# Patient Record
Sex: Female | Born: 1970 | Race: Black or African American | Hispanic: No | Marital: Single | Smoking: Never smoker
Health system: Southern US, Community
[De-identification: ages and names within clinical notes are randomized; demographics above are authoritative.]

## PROBLEM LIST (undated history)

## (undated) DIAGNOSIS — Z8742 Personal history of other diseases of the female genital tract: Secondary | ICD-10-CM

## (undated) DIAGNOSIS — Z8719 Personal history of other diseases of the digestive system: Secondary | ICD-10-CM

## (undated) DIAGNOSIS — R1032 Left lower quadrant pain: Secondary | ICD-10-CM

## (undated) DIAGNOSIS — D219 Benign neoplasm of connective and other soft tissue, unspecified: Secondary | ICD-10-CM

## (undated) DIAGNOSIS — I10 Essential (primary) hypertension: Secondary | ICD-10-CM

## (undated) DIAGNOSIS — Z87898 Personal history of other specified conditions: Secondary | ICD-10-CM

## (undated) DIAGNOSIS — F329 Major depressive disorder, single episode, unspecified: Secondary | ICD-10-CM

## (undated) DIAGNOSIS — Z86010 Personal history of colonic polyps: Secondary | ICD-10-CM

## (undated) DIAGNOSIS — Z8619 Personal history of other infectious and parasitic diseases: Secondary | ICD-10-CM

## (undated) DIAGNOSIS — D649 Anemia, unspecified: Secondary | ICD-10-CM

## (undated) DIAGNOSIS — M549 Dorsalgia, unspecified: Secondary | ICD-10-CM

## (undated) DIAGNOSIS — IMO0002 Reserved for concepts with insufficient information to code with codable children: Secondary | ICD-10-CM

## (undated) HISTORY — DX: Anemia, unspecified: D64.9

## (undated) HISTORY — PX: COLONOSCOPY: SHX174

## (undated) HISTORY — PX: OTHER SURGICAL HISTORY: SHX169

## (undated) HISTORY — DX: Personal history of other infectious and parasitic diseases: Z86.19

## (undated) HISTORY — DX: Personal history of other diseases of the female genital tract: Z87.42

## (undated) HISTORY — DX: Major depressive disorder, single episode, unspecified: F32.9

## (undated) HISTORY — PX: NO PAST SURGERIES: SHX2092

## (undated) HISTORY — DX: Dorsalgia, unspecified: M54.9

## (undated) HISTORY — DX: Personal history of other diseases of the digestive system: Z87.19

## (undated) HISTORY — DX: Left lower quadrant pain: R10.32

## (undated) HISTORY — DX: Essential (primary) hypertension: I10

## (undated) HISTORY — DX: Personal history of other specified conditions: Z87.898

## (undated) HISTORY — DX: Personal history of colonic polyps: Z86.010

## (undated) HISTORY — DX: Benign neoplasm of connective and other soft tissue, unspecified: D21.9

## (undated) HISTORY — DX: Reserved for concepts with insufficient information to code with codable children: IMO0002

---

## 1996-01-26 DIAGNOSIS — Z8719 Personal history of other diseases of the digestive system: Secondary | ICD-10-CM

## 1996-01-26 HISTORY — DX: Personal history of other diseases of the digestive system: Z87.19

## 1997-01-17 DIAGNOSIS — M549 Dorsalgia, unspecified: Secondary | ICD-10-CM

## 1997-01-17 DIAGNOSIS — Z87898 Personal history of other specified conditions: Secondary | ICD-10-CM

## 1997-01-17 HISTORY — DX: Dorsalgia, unspecified: M54.9

## 1997-01-17 HISTORY — DX: Personal history of other specified conditions: Z87.898

## 1997-05-01 DIAGNOSIS — Z8719 Personal history of other diseases of the digestive system: Secondary | ICD-10-CM

## 1997-05-01 HISTORY — DX: Personal history of other diseases of the digestive system: Z87.19

## 1997-07-11 DIAGNOSIS — D219 Benign neoplasm of connective and other soft tissue, unspecified: Secondary | ICD-10-CM

## 1997-07-11 HISTORY — DX: Benign neoplasm of connective and other soft tissue, unspecified: D21.9

## 1998-01-18 ENCOUNTER — Other Ambulatory Visit: Admission: RE | Admit: 1998-01-18 | Discharge: 1998-01-18 | Payer: Self-pay | Admitting: Obstetrics and Gynecology

## 1999-01-16 ENCOUNTER — Other Ambulatory Visit: Admission: RE | Admit: 1999-01-16 | Discharge: 1999-01-16 | Payer: Self-pay | Admitting: Obstetrics and Gynecology

## 1999-01-16 DIAGNOSIS — Z8719 Personal history of other diseases of the digestive system: Secondary | ICD-10-CM

## 1999-01-16 HISTORY — DX: Personal history of other diseases of the digestive system: Z87.19

## 2000-03-17 ENCOUNTER — Other Ambulatory Visit: Admission: RE | Admit: 2000-03-17 | Discharge: 2000-03-17 | Payer: Self-pay | Admitting: Obstetrics and Gynecology

## 2001-02-03 ENCOUNTER — Other Ambulatory Visit: Admission: RE | Admit: 2001-02-03 | Discharge: 2001-02-03 | Payer: Self-pay | Admitting: Obstetrics and Gynecology

## 2001-02-03 DIAGNOSIS — R1032 Left lower quadrant pain: Secondary | ICD-10-CM

## 2001-02-03 HISTORY — DX: Left lower quadrant pain: R10.32

## 2001-02-05 ENCOUNTER — Ambulatory Visit (HOSPITAL_COMMUNITY): Admission: RE | Admit: 2001-02-05 | Discharge: 2001-02-05 | Payer: Self-pay | Admitting: Obstetrics and Gynecology

## 2001-02-05 ENCOUNTER — Encounter: Payer: Self-pay | Admitting: Obstetrics and Gynecology

## 2001-12-15 ENCOUNTER — Inpatient Hospital Stay (HOSPITAL_COMMUNITY): Admission: AD | Admit: 2001-12-15 | Discharge: 2001-12-17 | Payer: Self-pay | Admitting: Obstetrics and Gynecology

## 2002-03-02 ENCOUNTER — Other Ambulatory Visit: Admission: RE | Admit: 2002-03-02 | Discharge: 2002-03-02 | Payer: Self-pay | Admitting: Obstetrics and Gynecology

## 2003-04-17 ENCOUNTER — Other Ambulatory Visit: Admission: RE | Admit: 2003-04-17 | Discharge: 2003-04-17 | Payer: Self-pay | Admitting: Obstetrics and Gynecology

## 2004-05-20 ENCOUNTER — Other Ambulatory Visit: Admission: RE | Admit: 2004-05-20 | Discharge: 2004-05-20 | Payer: Self-pay | Admitting: Obstetrics and Gynecology

## 2004-07-31 DIAGNOSIS — IMO0002 Reserved for concepts with insufficient information to code with codable children: Secondary | ICD-10-CM

## 2004-07-31 DIAGNOSIS — R87619 Unspecified abnormal cytological findings in specimens from cervix uteri: Secondary | ICD-10-CM

## 2004-07-31 HISTORY — DX: Reserved for concepts with insufficient information to code with codable children: IMO0002

## 2004-07-31 HISTORY — DX: Unspecified abnormal cytological findings in specimens from cervix uteri: R87.619

## 2004-12-03 ENCOUNTER — Other Ambulatory Visit: Admission: RE | Admit: 2004-12-03 | Discharge: 2004-12-03 | Payer: Self-pay | Admitting: Obstetrics and Gynecology

## 2005-05-21 ENCOUNTER — Other Ambulatory Visit: Admission: RE | Admit: 2005-05-21 | Discharge: 2005-05-21 | Payer: Self-pay | Admitting: Obstetrics and Gynecology

## 2005-11-24 ENCOUNTER — Other Ambulatory Visit: Admission: RE | Admit: 2005-11-24 | Discharge: 2005-11-24 | Payer: Self-pay | Admitting: Obstetrics and Gynecology

## 2010-05-20 ENCOUNTER — Encounter: Admission: RE | Admit: 2010-05-20 | Discharge: 2010-05-20 | Payer: Self-pay | Admitting: Family Medicine

## 2010-07-08 ENCOUNTER — Encounter: Payer: Self-pay | Admitting: Family Medicine

## 2010-11-01 NOTE — H&P (Signed)
[facility]  Patient:    Mary Preston, Mary Preston Visit Number: [id number] MRN: [id number]          Service Type: OBS Location: 910B [id number] [id number] Attending Physician:  Shaune Spittle Dictated by:   Wynelle Bourgeois, CNM Admit Date:  12/15/2001                           History and Physical  CHIEF COMPLAINT:              This is a 40 year old gravida 2 para 1, 0-0-1, at 40-6/7th weeks, who presents with complaint of leaking clear fluid since 1 a.m.  HISTORY OF PRESENT ILLNESS:   She denies feeling contractions.  Her pregnancy has been followed by the M.D. service and remarkable for:                               1. Late care.                               2. Desires tubal ligation, which she currently                                  now declines.  PAST OBSTETRICAL HISTORY:     Remarkable for vaginal delivery in 1993 of a female infant at [redacted] weeks gestation weighing 7 pounds 9 ounces.  PAST MEDICAL HISTORY:         1. Childhood varicella.                               2. Irritable bowel syndrome.  PAST SURGICAL HISTORY:        Laparoscopy in 1999.  FAMILY HISTORY:               Remarkable for myocardial infarction and hypertension in her paternal aunts and uncles.  Maternal grandmother with an aneurysm.  Paternal aunts with diabetes and thyroid problems.  Substance abuse in several family members.  GENETIC HISTORY:              Remarkable for father of the baby being 6 years old and the patients son who has sickle cell trait.  SOCIAL HISTORY:               The patient is single but involved with [employer].  She is of nondenominational Christian faith and denies any alcohol, tobacco, or drug use.  PRENATAL LABORATORY DATA:     Hemoglobin 12.3, platelets 264,000.  Blood type B-positive.  Antibody screen negative.  RPR nonreactive.  Rubella immune. HBsAg negative.  HIV nonreactive.  Pap smear normal.  Gonorrhea negative. Chlamydia negative.   Glucose challenge negative.  Group B strep negative.  PHYSICAL EXAMINATION:  VITAL SIGNS:                  Stable.  Afebrile.  HEENT:                        Within normal limits.  NECK:                         Thyroid normal, not enlarged.  CHEST:  Clear to auscultation.  HEART:                        Regular rate and rhythm.  ABDOMEN:                      Gravid. Vertex to [place].  EFW approximately approximately 7-1/2 pounds.  PELVIC:                       Sterile speculum examination - positive pooling, positive nitrazine, positive ferning.  Clear fluid leaking from vagina. Cervical examination - 1 cm, 50% effaced, -2 station with vertex well applied.  EXTREMITIES:                  Within normal limits.  ASSESSMENT:                   1. Intrauterine pregnancy at 40-6/7th weeks.                               2. Spontaneous rupture of membranes.                               3. Early labor (not in active labor yet).  PLAN:                         1. Admit to birthing suite per Dr. Pennie Rushing.                               2. Routine M.D. orders.                               3. Start Pitocin at 7 a.m. if not in labor. Dictated by:   Wynelle Bourgeois, CNM Attending Physician:  Shaune Spittle DD:  12/15/01 TD:  12/15/01 Job: [id number]

## 2011-05-01 ENCOUNTER — Other Ambulatory Visit: Payer: Self-pay | Admitting: Family Medicine

## 2011-05-01 DIAGNOSIS — Z1231 Encounter for screening mammogram for malignant neoplasm of breast: Secondary | ICD-10-CM

## 2011-05-26 ENCOUNTER — Ambulatory Visit
Admission: RE | Admit: 2011-05-26 | Discharge: 2011-05-26 | Disposition: A | Discharge status: Home / Self Care | Payer: BC Managed Care – PPO | Source: Ambulatory Visit | Attending: Family Medicine | Admitting: Family Medicine

## 2011-05-26 DIAGNOSIS — Z1231 Encounter for screening mammogram for malignant neoplasm of breast: Secondary | ICD-10-CM

## 2011-08-13 ENCOUNTER — Ambulatory Visit: Payer: Self-pay | Admitting: Obstetrics and Gynecology

## 2011-09-16 ENCOUNTER — Ambulatory Visit (INDEPENDENT_AMBULATORY_CARE_PROVIDER_SITE_OTHER): Payer: BC Managed Care – PPO | Admitting: Obstetrics and Gynecology

## 2011-09-16 DIAGNOSIS — F32A Depression, unspecified: Secondary | ICD-10-CM

## 2011-09-16 DIAGNOSIS — Z01419 Encounter for gynecological examination (general) (routine) without abnormal findings: Secondary | ICD-10-CM

## 2011-09-16 HISTORY — DX: Depression, unspecified: F32.A

## 2011-10-09 ENCOUNTER — Ambulatory Visit (INDEPENDENT_AMBULATORY_CARE_PROVIDER_SITE_OTHER): Payer: BC Managed Care – PPO | Admitting: Obstetrics and Gynecology

## 2011-10-09 ENCOUNTER — Encounter: Payer: Self-pay | Admitting: Obstetrics and Gynecology

## 2011-10-09 VITALS — BP 110/70 | Ht 64.0 in | Wt 147.0 lb

## 2011-10-09 DIAGNOSIS — E663 Overweight: Secondary | ICD-10-CM

## 2011-10-09 DIAGNOSIS — F329 Major depressive disorder, single episode, unspecified: Secondary | ICD-10-CM

## 2011-10-09 DIAGNOSIS — R21 Rash and other nonspecific skin eruption: Secondary | ICD-10-CM

## 2011-10-09 DIAGNOSIS — F4329 Adjustment disorder with other symptoms: Secondary | ICD-10-CM

## 2011-10-09 NOTE — Progress Notes (Signed)
[name] is a 41 y.o. year old female,No obstetric history on file., who presents for a problem visit.  Subjective:  The patient's mother died last year and she continues to grieve.  She was started on Celexa 20 mg each day.  She reports that she did feel better, but developed a rash on her lips that was very concerning.  She discontinued her medication.  Objective:  BP 110/70  Ht 5\' 4"  (1.626 m)  Wt 147 lb (66.679 kg)  BMI 25.23 kg/m2  LMP 09/13/2011   General: mild distress The patient has a dry and blistering rash on her lips.  Exam deferred.  Assessment:  Grief reaction  Rash on her lips  Plan:  HSV 1  A 20 min. Visit (greater than 50% face-to-face) was conducted as we talk about the above diagnosis.  The patient is very stressed about the rash on her lips.  Return to office in 4 week(s).   Leonard Schwartz M.D.  10/09/2011 3:56 PM

## 2011-10-09 NOTE — Patient Instructions (Signed)
Fever Blisters, Herpes Simplex Herpes simplex is a virus. This virus causes fever blisters or cold sores. Fever blisters are small sores on the lips, gums, or roof of the mouth. People often get infected with this herpes virus but do not have any symptoms. The blisters may break out when a person is:  Tired.   Under stress.   Suffering from another infection (such as a cold).   Exposed to sunlight.  The blisters usually heal within 1 week. The virus can be easily passed to other people and to other parts of the body, such as the eyes and sex organs. CAUSES  A virus, herpes simplex, is the cause of fever blisters. This virus can be passed (transmitted) from person to person and is therefore contagious. There are 2 types of herpes simplex virus. Type 1 usually causes oral herpes or fever blisters. Type 2 usually causes genital herpes. Both viruses do have the potential to cause oral and genital infections. However, the type 1 virus causes more than 90% of recurrent fever blister outbreaks.  Herpes simplex virus is highly contagious when fever blisters are present. Close contact, including kissing, can spread the virus. Children often become infected by contact with others who have fever blisters. A child can spread the virus by rubbing the cold sore and touching other children or when other children touch clothing, wipes, or toys contaminated by an infected child with the virus. In adults, about 10% of oral herpes infections are from oral-genital sex with a person who has active genital herpes (type 2).  Type 1 herpes infection is very common, eventually occurring in up to 8 out of 10 otherwise healthy people. Most people become infected before they are 42 years old. The virus usually infects the lips, throat, or mouth. Initial infection in children can be extensive with many lesions throughout the mouth. In adults, the first infection may cause no symptoms. Some adults may develop many fluid-filled  blisters inside and outside the mouth 3 to 5 days after they are initially infected but severe infection is uncommon. Fever, swollen neck glands, and general aches may occur but this is also uncommon. The blisters tend to come together and then collapse. When on the lip, a yellowish crust forms over the sores. Healing of the area without scarring typically occurs within 2 weeks. Once a person is infected, the herpes virus permanently remains alive in the body within a nerve near the cheekbone. It then stays inactive at this site, only to sometimes travel down the nerve to the skin. This causes a recurrence of fever blisters. Recurrent blisters usually break out at the outside edge of the lip or edge of the nostril. Recurrent fever blisters may occasionally occur on the chin, cheeks, or inside the mouth. Recurrent fever blister attacks are usually not as painful and not as numerous as the first infection. Recurrences are less frequent after age 35. Many people who have recurring fever blisters feel itching, tingling, or burning at the lip border. This can occur hours or a couple days before the blister appears.  Factors which weaken the body's immune system may trigger an outbreak or recurrence of herpes. These include some drugs (such as steroids), emotional stress, fever, illness, sleep deprivation, and other injuries. Sunlight may also trigger an outbreak. Many women have recurrences only during their menstrual period.  TREATMENT There is no cure for fever blisters. There is no vaccine for herpes simplex virus.  Certain medicines can relieve some of the pain   and discomfort of the sores or promote more rapid healing. These include ointments that numb the blisters and medicines that control bacterial infections (antibiotics). A number of drugs active against herpes viruses (antivirals), either applied locally as a gel or cream, or taken in pill form, may promote healing by keeping the virus from multiplying  and infecting more local tissue.   Keep fever blisters clean and dry. This helps to prevent bacterial invasion of the virally infected tissues.   Eat a soft, bland diet to avoid irritating the sores.   Be careful not to touch the sores and spread the virus to new sites, such as:   Other areas of the face.   Eyes.   Genitals.   Make sure you do not infect others. Avoid kissing people when a fever blister is present. Avoid touching the sores and then touching others.   Sunscreen on the lips can prevent recurrences if outbreaks are triggered by sunlight. The sunscreen should be put on before going outside and reapplied often while in the sun.   Avoid stress if this seems to cause outbreaks.  HOME CARE INSTRUCTIONS   Only take over-the-counter or prescription medicines for pain, discomfort, or fever as directed by your caregiver. Do not use aspirin.   Do not touch the blisters or pick the scabs. Wash your hands often. Do not touch your eyes without washing your hands first.   Avoid close contact with other people, especially kissing, until blisters heal.   Hot, cold, or salty foods may hurt your mouth. Use a straw to drink. Eating a well-balanced diet will help healing.  SEEK MEDICAL CARE IF:   Your eye feels irritated, painful, or you feel like you have something in your eye.   You develop a fever, feel achy, or see pus instead of clear fluid in the sores. These are signs of a bacterial infection.   You get blisters on your genitals.   You develop new, unexplained symptoms.  MAKE SURE YOU:   Understand these instructions.   Will watch your condition.   Will get help right away if you are not doing well or get worse.  Document Released: 06/02/2005 Document Revised: 05/22/2011 Document Reviewed: 10/07/2007 Exit[employer] Patient Information 2012 [employer]. 

## 2011-10-10 LAB — HSV 1 ANTIBODY, IGG: HSV 1 Glycoprotein G Ab, IgG: <0.1 IV

## 2011-11-12 ENCOUNTER — Ambulatory Visit (INDEPENDENT_AMBULATORY_CARE_PROVIDER_SITE_OTHER): Payer: BC Managed Care – PPO | Admitting: Obstetrics and Gynecology

## 2011-11-12 ENCOUNTER — Encounter: Payer: Self-pay | Admitting: Obstetrics and Gynecology

## 2011-11-12 VITALS — BP 120/88 | Resp 16 | Ht 64.0 in | Wt 150.0 lb

## 2011-11-12 DIAGNOSIS — F4329 Adjustment disorder with other symptoms: Secondary | ICD-10-CM

## 2011-11-12 DIAGNOSIS — F329 Major depressive disorder, single episode, unspecified: Secondary | ICD-10-CM

## 2011-11-12 DIAGNOSIS — R21 Rash and other nonspecific skin eruption: Secondary | ICD-10-CM

## 2011-11-12 MED ORDER — FLUOXETINE HCL 20 MG PO CAPS: 20.0000 mg | ORAL_CAPSULE | Freq: Every day | ORAL | Status: DC

## 2011-11-12 NOTE — Progress Notes (Signed)
[name] is a 41 y.o. year old female,G2P2002, who presents for a problem visit. The patient's mother died 1 year ago and she continues to grieve.  She was started on Celexa and reports that she did feel better.  She developed a blistering rash on her lips.  She was treated with prednisone and Valtrex by her primary physician.  Her herpes test returned negative.  The patient discontinued her Celexa.  The rash resolved.  Subjective:  The patient reports that she again is very sad (worse since she stopped her medication).  Objective:  BP 120/88  Resp 16  Ht 5\' 4"  (1.626 m)  Wt 150 lb (68.04 kg)  BMI 25.75 kg/m2  LMP 11/01/2011   General: alert, cooperative and she cries as we discussed the death of her mother.  The rash on her lips has completely resolved.  Exam deferred.  Assessment:  Grief reaction Rash on her lips has resolved  Plan:  Grief discussed.  We will begin Prozac.  The patient will call if her rash returns.  She declined to start Celexa again.  Return to office in 6 week(s).   Leonard Schwartz M.D.  11/12/2011 2:21 PM

## 2011-12-24 ENCOUNTER — Encounter: Payer: BC Managed Care – PPO | Admitting: Obstetrics and Gynecology

## 2012-04-26 ENCOUNTER — Other Ambulatory Visit: Payer: Self-pay | Admitting: Family Medicine

## 2012-04-26 DIAGNOSIS — Z1231 Encounter for screening mammogram for malignant neoplasm of breast: Secondary | ICD-10-CM

## 2012-06-03 ENCOUNTER — Ambulatory Visit
Admission: RE | Admit: 2012-06-03 | Discharge: 2012-06-03 | Disposition: A | Discharge status: Home / Self Care | Payer: BC Managed Care – PPO | Source: Ambulatory Visit | Attending: Family Medicine | Admitting: Family Medicine

## 2012-06-03 DIAGNOSIS — Z1231 Encounter for screening mammogram for malignant neoplasm of breast: Secondary | ICD-10-CM

## 2013-05-03 ENCOUNTER — Other Ambulatory Visit: Payer: Self-pay

## 2013-05-03 DIAGNOSIS — Z1231 Encounter for screening mammogram for malignant neoplasm of breast: Secondary | ICD-10-CM

## 2013-06-06 ENCOUNTER — Ambulatory Visit
Admission: RE | Admit: 2013-06-06 | Discharge: 2013-06-06 | Disposition: A | Discharge status: Home / Self Care | Payer: BC Managed Care – PPO | Source: Ambulatory Visit

## 2013-06-06 DIAGNOSIS — Z1231 Encounter for screening mammogram for malignant neoplasm of breast: Secondary | ICD-10-CM

## 2014-04-17 ENCOUNTER — Encounter: Payer: Self-pay | Admitting: Obstetrics and Gynecology

## 2014-05-18 ENCOUNTER — Other Ambulatory Visit: Payer: Self-pay

## 2014-05-18 DIAGNOSIS — Z1231 Encounter for screening mammogram for malignant neoplasm of breast: Secondary | ICD-10-CM

## 2014-06-07 ENCOUNTER — Ambulatory Visit
Admission: RE | Admit: 2014-06-07 | Discharge: 2014-06-07 | Disposition: A | Discharge status: Home / Self Care | Payer: PRIVATE HEALTH INSURANCE | Source: Ambulatory Visit

## 2014-06-07 ENCOUNTER — Other Ambulatory Visit: Payer: Self-pay

## 2014-06-07 DIAGNOSIS — Z1231 Encounter for screening mammogram for malignant neoplasm of breast: Secondary | ICD-10-CM

## 2015-06-26 DIAGNOSIS — L658 Other specified nonscarring hair loss: Secondary | ICD-10-CM | POA: Diagnosis not present

## 2015-06-26 DIAGNOSIS — L908 Other atrophic disorders of skin: Secondary | ICD-10-CM | POA: Diagnosis not present

## 2015-06-26 DIAGNOSIS — L309 Dermatitis, unspecified: Secondary | ICD-10-CM | POA: Diagnosis not present

## 2015-06-26 DIAGNOSIS — D239 Other benign neoplasm of skin, unspecified: Secondary | ICD-10-CM | POA: Diagnosis not present

## 2015-06-26 DIAGNOSIS — L84 Corns and callosities: Secondary | ICD-10-CM | POA: Diagnosis not present

## 2015-09-11 DIAGNOSIS — D2312 Other benign neoplasm of skin of left eyelid, including canthus: Secondary | ICD-10-CM | POA: Diagnosis not present

## 2015-09-11 DIAGNOSIS — D2311 Other benign neoplasm of skin of right eyelid, including canthus: Secondary | ICD-10-CM | POA: Diagnosis not present

## 2015-10-23 DIAGNOSIS — R102 Pelvic and perineal pain: Secondary | ICD-10-CM | POA: Diagnosis not present

## 2015-10-23 DIAGNOSIS — Z6827 Body mass index (BMI) 27.0-27.9, adult: Secondary | ICD-10-CM | POA: Diagnosis not present

## 2015-10-23 DIAGNOSIS — Z124 Encounter for screening for malignant neoplasm of cervix: Secondary | ICD-10-CM | POA: Diagnosis not present

## 2015-10-23 DIAGNOSIS — Z01419 Encounter for gynecological examination (general) (routine) without abnormal findings: Secondary | ICD-10-CM | POA: Diagnosis not present

## 2015-10-29 ENCOUNTER — Other Ambulatory Visit: Payer: Self-pay | Admitting: Obstetrics and Gynecology

## 2015-10-29 DIAGNOSIS — R109 Unspecified abdominal pain: Secondary | ICD-10-CM

## 2015-11-06 ENCOUNTER — Ambulatory Visit (HOSPITAL_COMMUNITY): Payer: PRIVATE HEALTH INSURANCE

## 2015-11-09 ENCOUNTER — Other Ambulatory Visit: Payer: PRIVATE HEALTH INSURANCE

## 2015-11-09 ENCOUNTER — Encounter (HOSPITAL_COMMUNITY): Payer: Self-pay

## 2015-11-09 ENCOUNTER — Ambulatory Visit (HOSPITAL_COMMUNITY)
Admission: RE | Admit: 2015-11-09 | Discharge: 2015-11-09 | Disposition: A | Discharge status: Home / Self Care | Payer: 59 | Source: Ambulatory Visit | Attending: Obstetrics and Gynecology | Admitting: Obstetrics and Gynecology

## 2015-11-09 DIAGNOSIS — R938 Abnormal findings on diagnostic imaging of other specified body structures: Secondary | ICD-10-CM | POA: Insufficient documentation

## 2015-11-09 DIAGNOSIS — D259 Leiomyoma of uterus, unspecified: Secondary | ICD-10-CM | POA: Diagnosis not present

## 2015-11-09 DIAGNOSIS — R109 Unspecified abdominal pain: Secondary | ICD-10-CM | POA: Insufficient documentation

## 2015-11-09 MED ORDER — IOPAMIDOL (ISOVUE-300) INJECTION 61%
INTRAVENOUS | Status: AC
  Filled 2015-11-09: qty 100

## 2015-11-13 MED ORDER — IOPAMIDOL (ISOVUE-300) INJECTION 61%
100.0000 mL | Freq: Once | INTRAVENOUS | Status: AC | PRN
  Administered 2015-11-09: 100 mL via INTRAVENOUS

## 2016-02-14 DIAGNOSIS — R109 Unspecified abdominal pain: Secondary | ICD-10-CM | POA: Diagnosis not present

## 2016-02-14 DIAGNOSIS — R591 Generalized enlarged lymph nodes: Secondary | ICD-10-CM | POA: Diagnosis not present

## 2016-02-14 DIAGNOSIS — D259 Leiomyoma of uterus, unspecified: Secondary | ICD-10-CM | POA: Diagnosis not present

## 2016-04-22 DIAGNOSIS — E663 Overweight: Secondary | ICD-10-CM | POA: Diagnosis not present

## 2016-04-22 DIAGNOSIS — Z114 Encounter for screening for human immunodeficiency virus [HIV]: Secondary | ICD-10-CM | POA: Diagnosis not present

## 2016-04-22 DIAGNOSIS — Z131 Encounter for screening for diabetes mellitus: Secondary | ICD-10-CM | POA: Diagnosis not present

## 2016-04-22 DIAGNOSIS — M7989 Other specified soft tissue disorders: Secondary | ICD-10-CM | POA: Diagnosis not present

## 2016-04-22 DIAGNOSIS — E782 Mixed hyperlipidemia: Secondary | ICD-10-CM | POA: Diagnosis not present

## 2016-06-12 DIAGNOSIS — Z1231 Encounter for screening mammogram for malignant neoplasm of breast: Secondary | ICD-10-CM | POA: Diagnosis not present

## 2016-06-13 DIAGNOSIS — M545 Low back pain: Secondary | ICD-10-CM | POA: Diagnosis not present

## 2016-06-13 DIAGNOSIS — R1032 Left lower quadrant pain: Secondary | ICD-10-CM | POA: Diagnosis not present

## 2016-06-13 MED FILL — NAPROXEN 500 MG TABLET: 500 | 15 days supply | Qty: 30 | Fill #0

## 2016-06-18 ENCOUNTER — Other Ambulatory Visit (HOSPITAL_COMMUNITY): Payer: Self-pay | Admitting: Family Medicine

## 2016-06-18 DIAGNOSIS — R1032 Left lower quadrant pain: Secondary | ICD-10-CM

## 2016-06-18 DIAGNOSIS — R935 Abnormal findings on diagnostic imaging of other abdominal regions, including retroperitoneum: Secondary | ICD-10-CM

## 2016-06-27 MED FILL — OSELTAMIVIR PHOS 75 MG CAP: 75 | 10 days supply | Qty: 10 | Fill #0

## 2016-07-08 ENCOUNTER — Ambulatory Visit (HOSPITAL_COMMUNITY): Payer: PRIVATE HEALTH INSURANCE

## 2016-07-08 ENCOUNTER — Ambulatory Visit (HOSPITAL_COMMUNITY)
Admission: RE | Admit: 2016-07-08 | Discharge: 2016-07-08 | Disposition: A | Discharge status: Home / Self Care | Payer: 59 | Source: Ambulatory Visit | Attending: Family Medicine | Admitting: Family Medicine

## 2016-07-08 ENCOUNTER — Encounter (HOSPITAL_COMMUNITY): Payer: Self-pay

## 2016-07-08 DIAGNOSIS — N852 Hypertrophy of uterus: Secondary | ICD-10-CM | POA: Diagnosis not present

## 2016-07-08 DIAGNOSIS — R1032 Left lower quadrant pain: Secondary | ICD-10-CM | POA: Insufficient documentation

## 2016-07-08 DIAGNOSIS — D259 Leiomyoma of uterus, unspecified: Secondary | ICD-10-CM | POA: Insufficient documentation

## 2016-07-08 DIAGNOSIS — R935 Abnormal findings on diagnostic imaging of other abdominal regions, including retroperitoneum: Secondary | ICD-10-CM | POA: Insufficient documentation

## 2016-07-08 MED ORDER — IOPAMIDOL (ISOVUE-300) INJECTION 61%
100.0000 mL | Freq: Once | INTRAVENOUS | Status: AC | PRN
  Administered 2016-07-08: 100 mL via INTRAVENOUS

## 2016-07-23 DIAGNOSIS — H52203 Unspecified astigmatism, bilateral: Secondary | ICD-10-CM | POA: Diagnosis not present

## 2016-07-23 DIAGNOSIS — H5213 Myopia, bilateral: Secondary | ICD-10-CM | POA: Diagnosis not present

## 2016-10-29 DIAGNOSIS — Z124 Encounter for screening for malignant neoplasm of cervix: Secondary | ICD-10-CM | POA: Diagnosis not present

## 2016-10-29 DIAGNOSIS — R635 Abnormal weight gain: Secondary | ICD-10-CM | POA: Diagnosis not present

## 2016-10-29 DIAGNOSIS — Z6829 Body mass index (BMI) 29.0-29.9, adult: Secondary | ICD-10-CM | POA: Diagnosis not present

## 2016-10-29 DIAGNOSIS — D259 Leiomyoma of uterus, unspecified: Secondary | ICD-10-CM | POA: Diagnosis not present

## 2016-10-29 DIAGNOSIS — Z01411 Encounter for gynecological examination (general) (routine) with abnormal findings: Secondary | ICD-10-CM | POA: Diagnosis not present

## 2016-11-06 MED FILL — PREVIDENT 5000 ENAMEL PROTE: 1.1-5 | 20 days supply | Qty: 100 | Fill #0

## 2016-11-21 DIAGNOSIS — G479 Sleep disorder, unspecified: Secondary | ICD-10-CM | POA: Diagnosis not present

## 2016-11-21 DIAGNOSIS — M7989 Other specified soft tissue disorders: Secondary | ICD-10-CM | POA: Diagnosis not present

## 2016-11-21 DIAGNOSIS — Z713 Dietary counseling and surveillance: Secondary | ICD-10-CM | POA: Diagnosis not present

## 2017-04-06 DIAGNOSIS — R7309 Other abnormal glucose: Secondary | ICD-10-CM | POA: Diagnosis not present

## 2017-04-06 DIAGNOSIS — R030 Elevated blood-pressure reading, without diagnosis of hypertension: Secondary | ICD-10-CM | POA: Diagnosis not present

## 2017-04-06 DIAGNOSIS — E7800 Pure hypercholesterolemia, unspecified: Secondary | ICD-10-CM | POA: Diagnosis not present

## 2017-04-06 MED FILL — PREVIDENT 5000 ENAMEL PROTE: 1.1-5 | 20 days supply | Qty: 100 | Fill #1

## 2017-04-21 ENCOUNTER — Encounter: Payer: Self-pay | Admitting: Nurse Practitioner

## 2017-05-01 ENCOUNTER — Encounter (INDEPENDENT_AMBULATORY_CARE_PROVIDER_SITE_OTHER): Payer: Self-pay

## 2017-05-01 ENCOUNTER — Encounter: Payer: Self-pay | Admitting: Nurse Practitioner

## 2017-05-01 ENCOUNTER — Ambulatory Visit: Payer: 59 | Admitting: Nurse Practitioner

## 2017-05-01 VITALS — BP 106/68 | HR 80 | Ht 64.0 in | Wt 156.6 lb

## 2017-05-01 DIAGNOSIS — K5900 Constipation, unspecified: Secondary | ICD-10-CM | POA: Diagnosis not present

## 2017-05-01 DIAGNOSIS — R1032 Left lower quadrant pain: Secondary | ICD-10-CM

## 2017-05-01 NOTE — Progress Notes (Addendum)
Chief Complaint:  Left lower quadrant pain  HPI:   Patient is a 46 year old female with a 20 year history of intermittent LLQ pain radiating through to left lower back.  She had an exploratory lap in 1999 when pain started but nothing found. Pain not related to eating, not relieved with BM and it isn't related to activity. Pain does not wake up up at night. Pain is random and for years occurred only a couple of times a year. Over the last month or two she has been having more frequent episodes of the pain. Sometimes the pain can last all day.Ibuprofen helps  Bowel habits are at baseline,  BM 2-3 a week.  Saw [facility] about 8 years ago, given Amitiza but having more frequent BMs didn't help the pain.  She was seen at [facility] 2-3 times, no endoscopic workup done. She did have a CT scan in 2017 and another in 2018 without acute findings. She did have some prominent retroperitoneal lymph nodes but they decreased in size between 2017 and 2018. She has known uterine fibroids. Up to date on GYN exams. She has no blood in her stool. Weight is stable. No urinary sx.    Past Medical History:  Diagnosis Date  . Abdominal pain 1998  . Abnormal Pap smear 07/31/04   CIN 1  . Back pain 01/17/97  . Depression 09/16/11  . Fibroid 07/11/97  . H/O constipation 01/26/1996   With rectal bleeding  . H/O dysmenorrhea   . H/O fatigue 01/17/97  . H/O varicella   . History of rectal bleeding 05/01/97  . Hx of irritable bowel syndrome 01/16/1999  . Hypertension    08/02/09  . Irregular periods/menstrual cycles 2006  . LGSIL (low grade squamous intraepithelial dysplasia)   . LLQ pain 02/03/2001     Past Surgical History:  Procedure Laterality Date  . laposcopy     Family History  Problem Relation Age of Onset  . Hypertension Paternal Aunt   . Diabetes Paternal Aunt   . Heart disease Paternal Aunt        MI  . Hypertension Paternal Uncle   . Heart disease Paternal Uncle   . Colon cancer Neg Hx   . Stomach  cancer Neg Hx    Social History   Tobacco Use  . Smoking status: Never Smoker  . Smokeless tobacco: Never Used  Substance Use Topics  . Alcohol use: No  . Drug use: No   Current Outpatient Medications  Medication Sig Dispense Refill  . Biotin 1000 MCG tablet Take 1,000 mcg 3 (three) times daily by mouth.    . Cyanocobalamin (VITAMIN B 12 PO) Take by mouth.    . vitamin C (ASCORBIC ACID) 500 MG tablet Take 500 mg daily by mouth.    Marland Kitchen VITAMIN D, ERGOCALCIFEROL, PO Take by mouth.    Marland Kitchen FLUoxetine (PROZAC) 20 MG capsule Take 1 capsule (20 mg total) by mouth daily. 30 capsule 11  . Linoleic Acid Conjugated (CLA PO) Take by mouth.     No current facility-administered medications for this visit.    No Known Allergies   Review of Systems: All systems reviewed and negative except where noted in HPI.    Physical Exam: BP 106/68   Pulse 80   Ht 5\' 4"  (1.626 m)   Wt 156 lb 9.6 oz (71 kg)   BMI 26.88 kg/m  Constitutional:  Well-developed, female in no acute distress. Psychiatric: Normal mood and affect. Behavior  is normal. EENT: Pupils normal.  Conjunctivae are normal. No scleral icterus. Neck supple.  Cardiovascular: Normal rate, regular rhythm. No edema Pulmonary/chest: Effort normal and breath sounds normal. No wheezing, rales or rhonchi. Abdominal: Soft, nondistended. Nontender. Bowel sounds active throughout. There are no masses palpable. No hepatomegaly. Lymphadenopathy: No cervical adenopathy noted. Neurological: Alert and oriented to person place and time. Skin: Skin is warm and dry. No rashes noted.   ASSESSMENT AND PLAN:   46 year old female with 20-year history of LLQ pain.  She has been evaluated by gynecology.  CT scan in 2017 in 2018 were unrevealing.  Pain not related to eating nor bowel movements.  It seems unlikely that her pain is GI in nature but patient would like to proceed with further GI workup anyway .  She is interested in a colonoscopy.  I explained that  colonoscopy done solely for evaluation of pain may not yield much information regarding cause of pain.  The risks and benefits of colonoscopy with possible biopsies and/or polypectomy were discussed and the patient agrees to proceed.     Tye Savoy, NP  05/01/2017, 9:57 AM   Agree with Ms. Doren Kaspar's assessment and plan.  Since the patient is > 45 and African American this can be a screening exam  I will have dx changed  Gatha Mayer, MD, [facility]

## 2017-05-01 NOTE — Patient Instructions (Signed)
If you are age 47 or older, your body mass index should be between 23-30. Your Body mass index is 26.88 kg/m. If this is out of the aforementioned range listed, please consider follow up with your Primary Care Provider.  If you are age 89 or younger, your body mass index should be between 19-25. Your Body mass index is 26.88 kg/m. If this is out of the aformentioned range listed, please consider follow up with your Primary Care Provider.   You have been scheduled for a colonoscopy. Please follow written instructions given to you at your visit today.  Please pick up your prep supplies at the pharmacy within the next 1-3 days. If you use inhalers (even only as needed), please bring them with you on the day of your procedure. Your physician has requested that you go to www.startemmi.com and enter the access code given to you at your visit today. This web site gives a general overview about your procedure. However, you should still follow specific instructions given to you by our office regarding your preparation for the procedure.  Thank you for choosing me and [facility].   Tye Savoy, NP

## 2017-05-05 ENCOUNTER — Encounter: Payer: Self-pay | Admitting: Nurse Practitioner

## 2017-05-14 ENCOUNTER — Encounter: Payer: Self-pay | Admitting: Internal Medicine

## 2017-05-19 ENCOUNTER — Ambulatory Visit (AMBULATORY_SURGERY_CENTER): Payer: 59 | Admitting: Internal Medicine

## 2017-05-19 ENCOUNTER — Encounter: Payer: Self-pay | Admitting: Internal Medicine

## 2017-05-19 ENCOUNTER — Other Ambulatory Visit: Payer: Self-pay

## 2017-05-19 VITALS — BP 125/65 | HR 64 | Temp 98.2°F | Resp 16 | Ht 64.0 in | Wt 156.0 lb

## 2017-05-19 DIAGNOSIS — D125 Benign neoplasm of sigmoid colon: Secondary | ICD-10-CM | POA: Diagnosis not present

## 2017-05-19 DIAGNOSIS — K635 Polyp of colon: Secondary | ICD-10-CM

## 2017-05-19 DIAGNOSIS — D120 Benign neoplasm of cecum: Secondary | ICD-10-CM | POA: Diagnosis not present

## 2017-05-19 DIAGNOSIS — R1032 Left lower quadrant pain: Secondary | ICD-10-CM | POA: Diagnosis not present

## 2017-05-19 DIAGNOSIS — Z1211 Encounter for screening for malignant neoplasm of colon: Secondary | ICD-10-CM | POA: Diagnosis present

## 2017-05-19 MED ORDER — SODIUM CHLORIDE 0.9 % IV SOLN: 500.0000 mL | Freq: Once | INTRAVENOUS | Status: DC

## 2017-05-19 NOTE — Progress Notes (Signed)
Pt's states no medical or surgical changes since previsit or office visit. 

## 2017-05-19 NOTE — Op Note (Signed)
[facility] Patient Name: Mary Preston Procedure Date: 05/19/2017 3:34 PM MRN: [id number] Endoscopist: Gatha Mayer , MD Age: 46 Referring MD:  Date of Birth: Dec 18, 1970 Gender: Female Account #: [id number] Procedure:                Colonoscopy Indications:              Screening for colorectal malignant neoplasm, This                            is the patient's first colonoscopy Medicines:                Propofol per Anesthesia, Monitored Anesthesia Care Procedure:                Pre-Anesthesia Assessment:                           - Prior to the procedure, a History and Physical                            was performed, and patient medications and                            allergies were reviewed. The patient's tolerance of                            previous anesthesia was also reviewed. The risks                            and benefits of the procedure and the sedation                            options and risks were discussed with the patient.                            All questions were answered, and informed consent                            was obtained. Prior Anticoagulants: The patient has                            taken no previous anticoagulant or antiplatelet                            agents. ASA Grade Assessment: II - A patient with                            mild systemic disease. After reviewing the risks                            and benefits, the patient was deemed in                            satisfactory condition to undergo the procedure.  After obtaining informed consent, the colonoscope                            was passed under direct vision. Throughout the                            procedure, the patient's blood pressure, pulse, and                            oxygen saturations were monitored continuously. The                            Model CF-HQ190L [id number]) scope was introduced   through the anus and advanced to the the cecum,                            identified by appendiceal orifice and ileocecal                            valve. The patient tolerated the procedure well.                            The quality of the bowel preparation was excellent.                            The ileocecal valve, appendiceal orifice, and                            rectum were photographed. The colonoscopy was                            somewhat difficult due to significant looping.                            Successful completion of the procedure was aided by                            using manual pressure. The bowel preparation used                            was Miralax. Scope In: 3:37:41 PM Scope Out: 3:54:20 PM Scope Withdrawal Time: 0 hours 12 minutes 27 seconds  Total Procedure Duration: 0 hours 16 minutes 39 seconds  Findings:                 The perianal and digital rectal examinations were                            normal.                           Two sessile polyps were found in the sigmoid colon                            and cecum. The polyps were diminutive in size.  These polyps were removed with a cold snare.                            Resection and retrieval were complete. Verification                            of patient identification for the specimen was                            done. Estimated blood loss was minimal.                           The exam was otherwise without abnormality on                            direct and retroflexion views. Complications:            No immediate complications. Estimated Blood Loss:     Estimated blood loss was minimal. Impression:               - Two diminutive polyps in the sigmoid colon and in                            the cecum, removed with a cold snare. Resected and                            retrieved.                           - The examination was otherwise normal on direct                             and retroflexion views. Recommendation:           - Patient has a contact number available for                            emergencies. The signs and symptoms of potential                            delayed complications were discussed with the                            patient. Return to normal activities tomorrow.                            Written discharge instructions were provided to the                            patient.                           - Resume previous diet.                           - Continue present medications.                           -  Repeat colonoscopy is recommended. The                            colonoscopy date will be determined after pathology                            results from today's exam become available for                            review. Gatha Mayer, MD 05/19/2017 4:00:37 PM This report has been signed electronically.

## 2017-05-19 NOTE — Progress Notes (Signed)
Spontaneous respirations throughout. VSS. Resting comfortably. To PACU on room air. Report to  RN. 

## 2017-05-19 NOTE — Patient Instructions (Addendum)
   I found and removed 2 tiny polyps.  Nothing to do with your symptoms.  I will let you know pathology results and when to have another routine colonoscopy by mail and/or My Chart.  I appreciate the opportunity to care for you. Gatha Mayer, MD, [facility]  Handout given : Polyps.  YOU HAD AN ENDOSCOPIC PROCEDURE TODAY AT [facility]:   Refer to the procedure report that was given to you for any specific questions about what was found during the examination.  If the procedure report does not answer your questions, please call your gastroenterologist to clarify.  If you requested that your care partner not be given the details of your procedure findings, then the procedure report has been included in a sealed envelope for you to review at your convenience later.  YOU SHOULD EXPECT: Some feelings of bloating in the abdomen. Passage of more gas than usual.  Walking can help get rid of the air that was put into your GI tract during the procedure and reduce the bloating. If you had a lower endoscopy (such as a colonoscopy or flexible sigmoidoscopy) you may notice spotting of blood in your stool or on the toilet paper. If you underwent a bowel prep for your procedure, you may not have a normal bowel movement for a few days.  Please Note:  You might notice some irritation and congestion in your nose or some drainage.  This is from the oxygen used during your procedure.  There is no need for concern and it should clear up in a day or so.  SYMPTOMS TO REPORT IMMEDIATELY:   Following lower endoscopy (colonoscopy or flexible sigmoidoscopy):  Excessive amounts of blood in the stool  Significant tenderness or worsening of abdominal pains  Swelling of the abdomen that is new, acute  Fever of 100F or higher   For urgent or emergent issues, a gastroenterologist can be reached at any hour by calling [phone].   DIET:  We do recommend a small meal at first, but then you may  proceed to your regular diet.  Drink plenty of fluids but you should avoid alcoholic beverages for 24 hours.  ACTIVITY:  You should plan to take it easy for the rest of today and you should NOT DRIVE or use heavy machinery until tomorrow (because of the sedation medicines used during the test).    FOLLOW UP: Our staff will call the number listed on your records the next business day following your procedure to check on you and address any questions or concerns that you may have regarding the information given to you following your procedure. If we do not reach you, we will leave a message.  However, if you are feeling well and you are not experiencing any problems, there is no need to return our call.  We will assume that you have returned to your regular daily activities without incident.  If any biopsies were taken you will be contacted by phone or by letter within the next 1-3 weeks.  Please call us at [phone] if you have not heard about the biopsies in 3 weeks.    SIGNATURES/CONFIDENTIALITY: You and/or your care partner have signed paperwork which will be entered into your electronic medical record.  These signatures attest to the fact that that the information above on your After Visit Summary has been reviewed and is understood.  Full responsibility of the confidentiality of this discharge information lies with you and/or your care-partner.

## 2017-05-19 NOTE — Progress Notes (Signed)
Called to room to assist during endoscopic procedure.  Patient ID and intended procedure confirmed with present staff. Received instructions for my participation in the procedure from the performing physician.  

## 2017-05-20 ENCOUNTER — Telehealth: Payer: Self-pay

## 2017-05-20 NOTE — Telephone Encounter (Signed)
  Follow up Call-  Call back number 05/19/2017  Post procedure Call Back phone  # [phone]  Permission to leave phone message Yes  Some recent data might be hidden     Patient questions:  Do you have a fever, pain , or abdominal swelling? No. Pain Score  0 *  Have you tolerated food without any problems? Yes.    Have you been able to return to your normal activities? Yes.    Do you have any questions about your discharge instructions: Diet   No. Medications  No. Follow up visit  No.  Do you have questions or concerns about your Care? No.  Actions: * If pain score is 4 or above: No action needed, pain <4.

## 2017-05-26 ENCOUNTER — Encounter: Payer: Self-pay | Admitting: Internal Medicine

## 2017-05-26 DIAGNOSIS — Z86010 Personal history of colonic polyps: Secondary | ICD-10-CM

## 2017-05-26 DIAGNOSIS — Z860101 Personal history of adenomatous and serrated colon polyps: Secondary | ICD-10-CM

## 2017-05-26 HISTORY — DX: Personal history of adenomatous and serrated colon polyps: Z86.0101

## 2017-05-26 HISTORY — DX: Personal history of colonic polyps: Z86.010

## 2017-05-26 NOTE — Progress Notes (Signed)
2 adenomas Diminutive Recall 2023 My Chart note

## 2017-09-01 MED FILL — PREVIDENT 5000 ENAMEL PROTE: 1.1-5 | 20 days supply | Qty: 100 | Fill #2

## 2017-10-06 MED FILL — HYDROCORTISONE 2.5% OINT: 2.5 | 10 days supply | Qty: 28 | Fill #0

## 2017-10-23 MED FILL — POLY-IRON 150 MG CAPSULE: 150 | 30 days supply | Qty: 30 | Fill #0

## 2017-10-30 ENCOUNTER — Other Ambulatory Visit: Payer: Self-pay | Admitting: Obstetrics and Gynecology

## 2017-11-19 MED FILL — POLY-IRON 150 MG CAPSULE: 150 | 30 days supply | Qty: 30 | Fill #1

## 2017-12-25 MED FILL — POLY-IRON 150 MG CAPSULE: 150 | 30 days supply | Qty: 30 | Fill #2

## 2018-01-21 MED FILL — ESZOPICLONE 3 MG TABS: 3 | 30 days supply | Qty: 30 | Fill #0

## 2018-01-22 MED FILL — POLY-IRON 150 MG CAPSULE: 150 | 30 days supply | Qty: 30 | Fill #0

## 2018-02-25 MED FILL — POLY-IRON 150 MG CAPSULE: 150 | 30 days supply | Qty: 30 | Fill #1

## 2018-04-05 MED FILL — POLY-IRON 150 MG CAPSULE: 150 | 30 days supply | Qty: 30 | Fill #2

## 2018-05-07 MED FILL — POLY-IRON 150 MG CAPSULE: 150 | 30 days supply | Qty: 30 | Fill #3

## 2018-06-14 MED FILL — POLY-IRON 150 MG CAPSULE: 150 | 30 days supply | Qty: 30 | Fill #4

## 2018-07-26 MED FILL — POLY-IRON 150 MG CAPSULE: 150 | 30 days supply | Qty: 30 | Fill #5

## 2019-03-24 MED FILL — POLY-IRON 150 MG CAPSULE: 150 | 90 days supply | Qty: 90 | Fill #0

## 2019-05-31 ENCOUNTER — Other Ambulatory Visit: Payer: Self-pay | Admitting: Obstetrics and Gynecology

## 2019-05-31 DIAGNOSIS — N912 Amenorrhea, unspecified: Secondary | ICD-10-CM

## 2019-05-31 MED FILL — MEDROXYPROGESTERONE 10 MG T: 10 | 7 days supply | Qty: 7 | Fill #0

## 2019-06-20 ENCOUNTER — Other Ambulatory Visit: Payer: Self-pay | Admitting: Obstetrics and Gynecology

## 2019-06-20 DIAGNOSIS — R891 Abnormal level of hormones in specimens from other organs, systems and tissues: Secondary | ICD-10-CM

## 2019-06-24 ENCOUNTER — Ambulatory Visit
Admission: RE | Admit: 2019-06-24 | Discharge: 2019-06-24 | Disposition: A | Discharge status: Home / Self Care | Payer: PRIVATE HEALTH INSURANCE | Source: Ambulatory Visit | Attending: Obstetrics and Gynecology | Admitting: Obstetrics and Gynecology

## 2019-06-24 DIAGNOSIS — N912 Amenorrhea, unspecified: Secondary | ICD-10-CM

## 2019-07-14 ENCOUNTER — Other Ambulatory Visit: Payer: Self-pay

## 2019-07-14 ENCOUNTER — Ambulatory Visit
Admission: RE | Admit: 2019-07-14 | Discharge: 2019-07-14 | Disposition: A | Discharge status: Home / Self Care | Payer: No Typology Code available for payment source | Source: Ambulatory Visit | Attending: Obstetrics and Gynecology | Admitting: Obstetrics and Gynecology

## 2019-07-14 DIAGNOSIS — R891 Abnormal level of hormones in specimens from other organs, systems and tissues: Secondary | ICD-10-CM

## 2019-07-14 MED ORDER — GADOBENATE DIMEGLUMINE 529 MG/ML IV SOLN
7.0000 mL | Freq: Once | INTRAVENOUS | Status: AC | PRN
  Administered 2019-07-14: 7 mL via INTRAVENOUS

## 2019-07-29 MED FILL — POLY-IRON 150 MG CAPSULE: 150 | 90 days supply | Qty: 90 | Fill #1

## 2019-08-17 ENCOUNTER — Other Ambulatory Visit: Payer: Self-pay

## 2019-08-19 ENCOUNTER — Ambulatory Visit: Payer: No Typology Code available for payment source | Admitting: Internal Medicine

## 2019-08-19 ENCOUNTER — Other Ambulatory Visit: Payer: Self-pay

## 2019-08-19 ENCOUNTER — Encounter: Payer: Self-pay | Admitting: Internal Medicine

## 2019-08-19 VITALS — BP 118/70 | HR 67 | Ht 64.0 in | Wt 160.0 lb

## 2019-08-19 DIAGNOSIS — D497 Neoplasm of unspecified behavior of endocrine glands and other parts of nervous system: Secondary | ICD-10-CM

## 2019-08-19 DIAGNOSIS — E221 Hyperprolactinemia: Secondary | ICD-10-CM

## 2019-08-19 LAB — T4, FREE: Free T4: 0.85 ng/dL (ref 0.60–1.60)

## 2019-08-19 LAB — CORTISOL: Cortisol, Plasma: 6.8 ug/dL

## 2019-08-19 LAB — TSH: TSH: 1.53 u[IU]/mL (ref 0.35–4.50)

## 2019-08-19 LAB — T3, FREE: T3, Free: 3.6 pg/mL (ref 2.3–4.2)

## 2019-08-19 NOTE — Patient Instructions (Signed)
Please stop at the lab.  Please come back for labs in 1.5-2 months (in day 3-5 of your cycle).  Please come back for a follow-up appointment in 6 months.   Pituitary Tumors  Pituitary tumors are abnormal growths found in the pituitary gland. The pituitary gland is a small organ in the center of the brain. It makes hormones that affect growth and the functions of other glands in the body. In most cases, pituitary tumors grow slowly, are not cancerous (benign), and do not spread to other parts of the body. These tumors are best treated when they are found and diagnosed early. A pituitary tumor may produce hormones (functioning tumor) or not (non-functioning tumor). A pituitary tumor may cause:  Cushing disease. In this disease, the pituitary gland produces too much of a hormone called cortisol. This causes fat to build up in the face, back, and chest while the arms and legs become thin.  Acromegaly. This is a condition in which the hands, feet, and face are larger than normal.  Breast milk production, even when there is no pregnancy. What are the causes? The cause of most pituitary tumors is not known. In some cases, pituitary tumors may be passed from parent to child (inherited). What increases the risk? You are more likely to develop this condition if:  You have a family history of pituitary tumors.  You have certain syndromes caused by unwanted changes (mutations) in your genes. What are the signs or symptoms? Symptoms of this condition include:  Headaches.  Loss of consciousness.  Vision problems and eye muscle weakness.  Weakness or low energy.  Clear fluid draining from the nose.  Changes in the sense of smell.  Loss of body hair.  Nausea and vomiting.  Problems caused by the production of too many hormones, such as: ? Inability to get pregnant after a year of having sex regularly without using birth control (infertility). ? Loss of menstrual periods in  women. ? Abnormal growth. ? Diabetes insipidus or diabetes mellitus. ? High blood pressure (hypertension). ? Inability to tolerate heat or cold. ? Increase in sweating. ? Joint pain. ? Other skin and body changes. ? Nipple discharge. ? Changes in mood, or depression. ? Decreased sexual function. How is this diagnosed? This condition may be diagnosed based on:  Your symptoms.  Your medical history.  Blood or urine tests to check your hormone levels.  CT scan.  MRI.  Removal and examination of a small tumor tissue (biopsy). How is this treated? Treatment depends on the type of pituitary tumor you have and your overall health. Treatments may include:  Surgical removal of the tumor.  Using high doses of X-ray energy to kill tumor cells (radiation).  Using certain medicines to stop the pituitary gland from producing too many hormones (drug therapy). If you have a family history of pituitary tumors, you may need to have regular blood tests to monitor pituitary hormone levels. Follow these instructions at home:  Drink enough fluid to keep your urine clear or pale yellow.  If directed, follow instructions from your health care provider about measuring how much urine you pass.  Do not pick your nose or remove any crusting, if your nose is draining clear fluid. Tell your health care provider if this condition worsens.  Do not do any activities that require straining, such as heavy lifting. Ask your health care provider what activities are safe for you.  Take over-the-counter and prescription medicines only as told by your health  care provider.  Keep all follow-up visits as told by your health care provider. This is important, especially if you have a family history of pituitary tumors and you need regular blood tests. Contact a health care provider if:  You have sudden, unusual thirst.  You are urinating more often than usual.  You have a headache that does not go  away.  You develop new changes in your vision.  You have clear fluid leaking from your nose or ears.  You have a sensation of fluid trickling down the back of your throat.  You have a salty taste in your mouth.  You have trouble concentrating. Get help right away if:  Your symptoms suddenly become severe.  You have a nosebleed that does not stop after a few minutes.  You have a fever of over 101F (38.3C).  You have a severe headache.  You have a stiff neck.  You are confused or not as alert as usual.  You have chest pain.  You have shortness of breath. Summary  Pituitary tumors are abnormal growths found in the pituitary gland.  Treatment depends on the type of pituitary tumor you have and your overall health.  Keep all follow-up visits as told by your health care provider. This is important, especially if you have a family history of pituitary tumors and you need regular blood tests. This information is not intended to replace advice given to you by your health care provider. Make sure you discuss any questions you have with your health care provider. Document Revised: 11/15/2018 Document Reviewed: 08/19/2016 Elsevier Patient Education  [employer].

## 2019-08-19 NOTE — Progress Notes (Addendum)
Patient ID: Mary Preston, female   DOB: February 01, 1971, 49 y.o.   MRN: :[id number]   This visit occurred during the SARS-CoV-2 public health emergency.  Safety protocols were in place, including screening questions prior to the visit, additional usage of staff PPE, and extensive cleaning of exam room while observing appropriate contact time as indicated for disinfecting solutions.   HPI  Mary Preston is a 49 y.o.-year-old female, referred by her ObGyn Dr., Dr. Earnstine Regal, for evaluation for pituitary adenoma and hyperprolactinemia.  She is the niece of Mary Preston, who is also my patient.  Pt. has been dx with a pituitary adenoma in 2021, after she presented with 1 missed period, breast tenderness and a little breast discharge, dizziness, and was found to have a high prolactin level.  Reviewed the pituitary MRI report (07/14/2019): 5-6 mm pituitary adenoma: Overall pituitary size andconfiguration is normal but on both dynamic and delayed post-contrast images there is a 5-6 millimeter nodular area of hypoenhancement along the central and caudal aspect of the gland slightly eccentric to the right (series 21, image 7 and series 22, image 5. The remainder of the gland enhances homogeneously. Normal pituitary infundibulum, suprasellar cistern, hypothalamus. The cavernous sinuses remain normal. No other abnormal enhancement.    I reviewed prolactin levels: 08/08/2018: Prolactin 99.9 (4.8-23.3) 06/01/2019: Prolactin 101 (4.8-23.3), Urine pregnancy test negative  I reviewed pt's thyroid tests: 06/01/2019: TSH 1.49 No results found for: TSH, FREET4   She started Provera x 10 days in 05/2019 >> menses now regular, monthly.  She continues to have a small amount of clear breast discharge, but only with stimulation, not spontaneously.  Pt mentions: - no headaches - + weight gain - 6 lbs in last 2-3 mo - no fatigue - no constipation - no dry skin - no hair loss - no depression  She  has 2 children: 28 and 61 years old (08/2019).  She did not have problems with infertility or with breast-feeding.  Of note, she is on biotin 5000 mcg daily - off for last 2 days.  She is exercising by cardio 4-5 times a week.  ROS: + See HPI Constitutional: + Weight gain, no fatigue, no subjective hyperthermia/hypothermia Eyes: no blurry vision, no xerophthalmia ENT: no sore throat, no nodules in neck, no dysphagia/odynophagia, no hoarseness Cardiovascular: no CP/SOB/palpitations/leg swelling Respiratory: no cough/SOB Gastrointestinal: no N/V/D/C Musculoskeletal: no muscle/joint aches Skin: no rashes Neurological: no tremors/numbness/tingling/dizziness Psychiatric: no depression/anxiety  Past Medical History:  Diagnosis Date  . Abdominal pain 1998  . Abnormal Pap smear 07/31/04   CIN 1  . Back pain 01/17/97  . Depression 09/16/11  . Fibroid 07/11/97  . H/O constipation 01/26/1996   With rectal bleeding  . H/O dysmenorrhea   . H/O fatigue 01/17/97  . H/O varicella   . History of rectal bleeding 05/01/97  . Hx of adenomatous colonic polyps 05/26/2017  . Hx of irritable bowel syndrome 01/16/1999  . Hypertension    08/02/09  . Irregular periods/menstrual cycles 2006  . LGSIL (low grade squamous intraepithelial dysplasia)   . LLQ pain 02/03/2001   Past Surgical History:  Procedure Laterality Date  . laposcopy    . NO PAST SURGERIES     Social History   Socioeconomic History  . Marital status: Single    Spouse name: Not on file  . Number of children: 2  . Years of education: Not on file  . Highest education level: Not on file  Occupational History  .  Clinical coding analyst  Tobacco Use  . Smoking status: Never Smoker  . Smokeless tobacco: Never Used  Substance and Sexual Activity  . Alcohol use: No  . Drug use: No   Social Determinants of Health   Financial Resource Strain:   . Difficulty of Paying Living Expenses: Not on file  Food Insecurity:   . Worried About  Charity fundraiser in the Last Year: Not on file  . Ran Out of Food in the Last Year: Not on file  Transportation Needs:   . Lack of Transportation (Medical): Not on file  . Lack of Transportation (Non-Medical): Not on file  Physical Activity:   . Days of Exercise per Week: Not on file  . Minutes of Exercise per Session: Not on file  Stress:   . Feeling of Stress : Not on file  Social Connections:   . Frequency of Communication with Friends and Family: Not on file  . Frequency of Social Gatherings with Friends and Family: Not on file  . Attends Religious Services: Not on file  . Active Member of Clubs or Organizations: Not on file  . Attends Archivist Meetings: Not on file  . Marital Status: Not on file  Intimate Partner Violence:   . Fear of Current or Ex-Partner: Not on file  . Emotionally Abused: Not on file  . Physically Abused: Not on file  . Sexually Abused: Not on file   Current Outpatient Medications on File Prior to Visit  Medication Sig Dispense Refill  . Biotin 1000 MCG tablet Take 1,000 mcg 3 (three) times daily by mouth.    Marland Kitchen FLUoxetine (PROZAC) 20 MG capsule 20 mg.    . vitamin C (ASCORBIC ACID) 500 MG tablet Take 500 mg daily by mouth.    Marland Kitchen VITAMIN D, ERGOCALCIFEROL, PO Take by mouth.     Current Facility-Administered Medications on File Prior to Visit  Medication Dose Route Frequency Provider Last Rate Last Admin  . 0.9 %  sodium chloride infusion  500 mL Intravenous Once Gatha Mayer, MD       No Known Allergies Family History  Problem Relation Age of Onset  . Hypertension Paternal Aunt   . Diabetes Paternal Aunt   . Heart disease Paternal Aunt        MI  . Hypertension Paternal Uncle   . Heart disease Paternal Uncle   . Hypertension Mother   . Hyperlipidemia Mother   . Cancer Mother   . Hypertension Father   . Diabetes Father   . Hyperlipidemia Father   . Colon cancer Neg Hx   . Stomach cancer Neg Hx    PE: BP 118/70   Pulse 67    Ht 5\' 4"  (1.626 m)   Wt 160 lb (72.6 kg)   LMP 08/04/2019   SpO2 98%   BMI 27.46 kg/m  Wt Readings from Last 3 Encounters:  08/19/19 160 lb (72.6 kg)  05/19/17 156 lb (70.8 kg)  05/01/17 156 lb 9.6 oz (71 kg)   Constitutional: normal weight, in NAD Eyes: PERRLA, EOMI, no exophthalmos ENT: moist mucous membranes, no thyromegaly, no cervical lymphadenopathy Cardiovascular: RRR, No MRG Respiratory: CTA B Gastrointestinal: abdomen soft, NT, ND, BS+ Musculoskeletal: no deformities, strength intact in all 4 Skin: moist, warm, no rashes Neurological: no tremor with outstretched hands, DTR normal in all 4  ASSESSMENT: 1. Pituitary microadenoma  2.  Hyperprolactinemia  PLAN:  1. Patient with a newly diagnosed pituitary microadenoma, found during investigation  for a missed menstrual cycle, spontaneous breast discharge and tenderness, and hyperprolactinemia - I personally reviewed the pituitary MRI images and also reviewed the report along with the patient. I explained that, since the tumor is under 1 cm, this qualifies as a micro-, rather than a macro-adenoma.  - I explained that this is not a "brain tumor". These tumors are extremely rarely malignant, the vast majority of them are benign.  - Pituitary adenoma can be:  Not producing hormones, not compressing the pituitary gland or the optic chiasm  Not producing hormones, but compressing either the pituitary gland (causing hypopituitarism) or the optic chiasm (causing visual field cuts)  Producing hormones: - Prolactin (prolactinoma) - she did have spontaneous breast discharge and breast tenderness which led to a prolactin check.  This was elevated 2x. - ACTH (Cushing's disease) -  she does not have significant weight gain, central distribution of fat, wide, purple, stretch marks, full supraclavicular fat pads, diabetes, hypertension. - growth hormone (acromegaly) - she denies an enlarged jaw, change in the size of rings or changing shoe  sizes - LH or FSH (gonadotropin secreting tumor) -she is skipping menstrual cycles-we will check these, although, since her prolactin was elevated, they would most likely be suppressed.  However, since she is now close to ovulation, I will check this at next lab draw. - TSH (TSH secreting tumor) (very rare) - recent TSH has been normal, but I do not have associated free T4 and free T3 with it, we will recheck these - I ordered the following labs: Orders Placed This Encounter  Procedures  . Prolactin  . Insulin-like growth factor  . TSH  . T4, free  . T3, free  . Cortisol  . ACTH  -  since this is most likely a prolactinoma, a decreasing her prolactin level while on medical treatment is an indication that the tumor is not growing and may be even shrinking - I do not anticipate the need for pituitary surgery for her, however, we discussed that if she will require this in the future, there are 3 types of surgeries:  Transsphenoidal nasal (most commonly used)  Transsphenoidal sublabial - under upper lip  Open craniotomy (not applicable for her for such a small tumor) - we'll proceed with the above tests and will decide about further testing or treatment when the results are back - Return in about 6 months   2.  Hyperprolactinemia -There are many possible causes for hyperprolactinemia, however, in her case, this is most likely related to her pituitary tumor -I explained that this is treatable and discussed about dopamine agonist medications: Cabergoline and bromocriptine.  We discussed about benefits and side effects of both: Cabergoline, as opposed to bromocriptine also has the capacity to shrink the pituitary tumor.  Also, this is taken once or twice a week, rather than daily as bromocriptine and it is better tolerated.  Possible side effects are: Dizziness, nausea, headache, congestion.  We usually recommend taking these at night. -I will await her results today and we may need to start  Cabergoline after they are back  Component     Latest Ref Rng & Units 08/19/2019  IGF-I, LC/MS     52 - 328 ng/mL 100  Z-Score (Female)     -2.0 - 2 SD -0.7  Prolactin     ng/mL 51.4 (H)  TSH     0.35 - 4.50 uIU/mL 1.53  T4,Free(Direct)     0.60 - 1.60 ng/dL 0.85  Triiodothyronine,Free,Serum  2.3 - 4.2 pg/mL 3.6  Cortisol, Plasma     ug/dL 6.8  C206 ACTH     6 - 50 pg/mL <5 (L)   Her prolactin remains high, but is much improved compared to before.  TFTs are normal.  Cortisol is normal but ACTH is undetectable.  This sometimes happens from mishandling of the sample tube.  Since there are no signs or symptoms of adrenal insufficiency, I will wait and recheck her ACTH and cortisol at 8 in the morning when she returns for repeat prolactin level in 1.5 months.  For now, I would suggest to start cabergoline 0.25 mg 2x weekly and I am hoping to reduce the dose to once a week after next prolactin level check.  Component     Latest Ref Rng & Units 09/30/2019  Estradiol, Free     pg/mL 0.81  Estradiol     pg/mL 47  Prolactin     ng/mL 3.5  Cortisol, Plasma     ug/dL 5.6  C206 ACTH     6 - 50 pg/mL 12  FSH     mIU/ML 25.5  LH     mIU/mL 16.31   Pituitary tests normal, including ACTH.  We can reduce the dose of Cabergoline to only 0.25 mg once a week.  We can recheck a prolactin level at next visit.  Philemon Kingdom, MD PhD [facility] Endocrinology

## 2019-08-24 LAB — PROLACTIN: Prolactin: 51.4 ng/mL — ABNORMAL HIGH

## 2019-08-24 LAB — ACTH: C206 ACTH: <5 pg/mL — ABNORMAL LOW (ref 6–50)

## 2019-08-24 LAB — INSULIN-LIKE GROWTH FACTOR
IGF-I, LC/MS: 100 ng/mL (ref 52–328)
Z-Score (Female): -0.7 SD (ref ?–2.0)

## 2019-08-25 MED ORDER — CABERGOLINE 0.5 MG PO TABS: 0.2500 mg | ORAL_TABLET | ORAL | 3 refills | Status: DC

## 2019-08-25 MED FILL — CABERGOLINE 0.5 MG TABS: 0.5 | 28 days supply | Qty: 4 | Fill #0

## 2019-09-16 MED FILL — CABERGOLINE 0.5 MG TABS: 0.5 | 28 days supply | Qty: 4 | Fill #1

## 2019-09-29 MED FILL — HYDROCORTISONE 2.5% OINTMEN: 2.5 | 10 days supply | Qty: 20 | Fill #0

## 2019-09-30 ENCOUNTER — Other Ambulatory Visit (INDEPENDENT_AMBULATORY_CARE_PROVIDER_SITE_OTHER): Payer: No Typology Code available for payment source

## 2019-09-30 ENCOUNTER — Other Ambulatory Visit: Payer: No Typology Code available for payment source

## 2019-09-30 DIAGNOSIS — E221 Hyperprolactinemia: Secondary | ICD-10-CM

## 2019-09-30 DIAGNOSIS — D497 Neoplasm of unspecified behavior of endocrine glands and other parts of nervous system: Secondary | ICD-10-CM

## 2019-09-30 LAB — FOLLICLE STIMULATING HORMONE: FSH: 25.5 m[IU]/mL

## 2019-09-30 LAB — CORTISOL: Cortisol, Plasma: 5.6 ug/dL

## 2019-09-30 LAB — LUTEINIZING HORMONE: LH: 16.31 m[IU]/mL

## 2019-10-06 LAB — ESTRADIOL, FREE
Estradiol, Free: 0.81 pg/mL
Estradiol: 47 pg/mL

## 2019-10-06 LAB — ACTH: C206 ACTH: 12 pg/mL (ref 6–50)

## 2019-10-06 LAB — EXTRA SPECIMEN

## 2019-10-06 LAB — PROLACTIN: Prolactin: 3.5 ng/mL

## 2019-10-07 MED ORDER — CABERGOLINE 0.5 MG PO TABS: 0.2500 mg | ORAL_TABLET | ORAL | 3 refills | Status: DC

## 2019-10-07 NOTE — Addendum Note (Signed)
Addended by: Philemon Kingdom on: 10/07/2019 05:09 PM   Modules accepted: Orders

## 2019-10-13 MED FILL — CABERGOLINE 0.5 MG TABS: 0.5 | 28 days supply | Qty: 4 | Fill #2

## 2019-10-28 ENCOUNTER — Other Ambulatory Visit (HOSPITAL_COMMUNITY): Payer: Self-pay | Admitting: Family Medicine

## 2019-11-11 MED FILL — CABERGOLINE 0.5 MG TABS: 0.5 | 28 days supply | Qty: 4 | Fill #3

## 2019-11-11 MED FILL — HYDROCORTISONE 2.5% OINTMEN: 2.5 | 10 days supply | Qty: 20 | Fill #1

## 2020-01-26 MED FILL — CABERGOLINE 0.5 MG TABS: 0.5 | 28 days supply | Qty: 4 | Fill #4

## 2020-02-17 ENCOUNTER — Other Ambulatory Visit: Payer: Self-pay

## 2020-02-17 ENCOUNTER — Encounter: Payer: Self-pay | Admitting: Internal Medicine

## 2020-02-17 ENCOUNTER — Other Ambulatory Visit: Payer: Self-pay | Admitting: Internal Medicine

## 2020-02-17 ENCOUNTER — Ambulatory Visit: Payer: Commercial Managed Care - PPO | Admitting: Internal Medicine

## 2020-02-17 VITALS — BP 120/70 | HR 68 | Ht 64.0 in | Wt 161.0 lb

## 2020-02-17 DIAGNOSIS — E221 Hyperprolactinemia: Secondary | ICD-10-CM | POA: Diagnosis not present

## 2020-02-17 DIAGNOSIS — D497 Neoplasm of unspecified behavior of endocrine glands and other parts of nervous system: Secondary | ICD-10-CM

## 2020-02-17 MED ORDER — CABERGOLINE 0.5 MG PO TABS
0.2500 mg | ORAL_TABLET | ORAL | 3 refills | Status: DC
Start: 2020-02-17 — End: 2020-02-17

## 2020-02-17 MED FILL — CABERGOLINE 0.5 MG TABS: 0.5 | 28 days supply | Qty: 4 | Fill #0

## 2020-02-17 NOTE — Patient Instructions (Addendum)
Please stop Biotin and come back for labs in ~1 week.  Please continue Cabergoline 0.25 mg weekly.  Please come back for a follow-up appointment in 1 year.

## 2020-02-17 NOTE — Progress Notes (Addendum)
Patient ID: Mary Preston, female   DOB: 1971-05-22, 49 y.o.   MRN: [id number]   This visit occurred during the SARS-CoV-2 public health emergency.  Safety protocols were in place, including screening questions prior to the visit, additional usage of staff PPE, and extensive cleaning of exam room while observing appropriate contact time as indicated for disinfecting solutions.   HPI  Mary Preston is a 49 y.o.-year-old female, initially referred by her ObGyn Dr., Dr. Earnstine Preston, returning for follow-up for pituitary adenoma and hyperprolactinemia.  She is the niece of Mary Preston, who is also my patient. Last visit 6 months ago.  Patient was diagnosed with a pituitary adenoma in 2021 after she presented with 1 missed menstrual cycle, breast tenderness, and a little breast discharge, and also dizziness. At that time she was found to have a high prolactin level.  Reviewed the images and report of the pituitary MRI report (07/14/2019): 5 to 6 mm pituitary adenoma: Overall pituitary size andconfiguration is normal but on both dynamic and delayed post-contrast images there is a 5-6 millimeter nodular area of hypoenhancement along the central and caudal aspect of the gland slightly eccentric to the right (series 21, image 7 and series 22, image 5. The remainder of the gland enhances homogeneously. Normal pituitary infundibulum, suprasellar cistern, hypothalamus. The cavernous sinuses remain normal. No other abnormal enhancement.    Reviewed pituitary hormone levels:  Prolactin was high at last visit and ACTH was undetectably low while the rest of the pituitary hormones, including cortisol, were normal: Component     Latest Ref Rng & Units 08/19/2019  IGF-I, LC/MS     52 - 328 ng/mL 100  Z-Score (Female)     -2.0 - 2 SD -0.7  Prolactin     ng/mL 51.4 (H)  TSH     0.35 - 4.50 uIU/mL 1.53  T4,Free(Direct)     0.60 - 1.60 ng/dL 0.85  Triiodothyronine,Free,Serum     2.3 - 4.2 pg/mL  3.6  Cortisol, Plasma     ug/dL 6.8  C206 ACTH     6 - 50 pg/mL <5 (L)   Since there was low suspicion for adrenal insufficiency, we repeated a cortisol and ACTH a month later and these returned normal and prolactin decreased to the low normal range: Component     Latest Ref Rng & Units 09/30/2019  Estradiol, Free     pg/mL 0.81  Estradiol     pg/mL 47  Prolactin     ng/mL 3.5  Cortisol, Plasma     ug/dL 5.6  C206 ACTH     6 - 50 pg/mL 12  FSH     mIU/ML 25.5  LH     mIU/mL 16.31   Previously: 08/08/2018: Prolactin 99.9 (4.8-23.3) 06/01/2019: Prolactin 101 (4.8-23.3), Urine pregnancy test negative  In 09/2019, we reduced Cabergoline 0.25 mg weekly. She continues on this dose now.  She denies headaches, congestion, nausea, dizziness.  She was previously on Provera for 10 days in 05/2019 but she was able to stop afterwards. Her menstrual cycles are now regular, monthly.  No breast discharge, had this previously with stimulation, not spontaneously.  She has 2 children: 33 and 85 years old (08/2019).  She did not have problems with infertility or with breast-feeding.  She is back on biotin 5000 mcg daily-last dose yesterday.  She exercises by cardio 4-5 times a week.  ROS:  Constitutional: no weight gain/no weight loss, no fatigue, no subjective hyperthermia, no subjective hypothermia  Eyes: no blurry vision, no xerophthalmia ENT: no sore throat, no nodules palpated in neck, no dysphagia, no odynophagia, no hoarseness Cardiovascular: no CP/no SOB/no palpitations/no leg swelling Respiratory: no cough/no SOB/no wheezing Gastrointestinal: no N/no V/no D/no C/no acid reflux Musculoskeletal: no muscle aches/no joint aches Skin: no rashes, no hair loss Neurological: no tremors/no numbness/no tingling/no dizziness, + left-sided sciatic pain-improving  I reviewed pt's medications, allergies, PMH, social hx, family hx, and changes were documented in the history of present illness.  Otherwise, unchanged from my initial visit note.  Past Medical History:  Diagnosis Date  . Abdominal pain 1998  . Abnormal Pap smear 07/31/04   CIN 1  . Back pain 01/17/97  . Depression 09/16/11  . Fibroid 07/11/97  . H/O constipation 01/26/1996   With rectal bleeding  . H/O dysmenorrhea   . H/O fatigue 01/17/97  . H/O varicella   . History of rectal bleeding 05/01/97  . Hx of adenomatous colonic polyps 05/26/2017  . Hx of irritable bowel syndrome 01/16/1999  . Hypertension    08/02/09  . Irregular periods/menstrual cycles 2006  . LGSIL (low grade squamous intraepithelial dysplasia)   . LLQ pain 02/03/2001   Past Surgical History:  Procedure Laterality Date  . laposcopy    . NO PAST SURGERIES     Social History   Socioeconomic History  . Marital status: Single    Spouse name: Not on file  . Number of children: 2  . Years of education: Not on file  . Highest education level: Not on file  Occupational History  .  Clinical coding analyst  Tobacco Use  . Smoking status: Never Smoker  . Smokeless tobacco: Never Used  Substance and Sexual Activity  . Alcohol use: No  . Drug use: No   Social Determinants of Health   Financial Resource Strain:   . Difficulty of Paying Living Expenses: Not on file  Food Insecurity:   . Worried About Charity fundraiser in the Last Year: Not on file  . Ran Out of Food in the Last Year: Not on file  Transportation Needs:   . Lack of Transportation (Medical): Not on file  . Lack of Transportation (Non-Medical): Not on file  Physical Activity:   . Days of Exercise per Week: Not on file  . Minutes of Exercise per Session: Not on file  Stress:   . Feeling of Stress : Not on file  Social Connections:   . Frequency of Communication with Friends and Family: Not on file  . Frequency of Social Gatherings with Friends and Family: Not on file  . Attends Religious Services: Not on file  . Active Member of Clubs or Organizations: Not on file  . Attends  Archivist Meetings: Not on file  . Marital Status: Not on file  Intimate Partner Violence:   . Fear of Current or Ex-Partner: Not on file  . Emotionally Abused: Not on file  . Physically Abused: Not on file  . Sexually Abused: Not on file   Current Outpatient Medications on File Prior to Visit  Medication Sig Dispense Refill  . Biotin 1000 MCG tablet Take 1,000 mcg 3 (three) times daily by mouth.    . cabergoline (DOSTINEX) 0.5 MG tablet Take 0.5 tablets (0.25 mg total) by mouth 2 (two) times a week. 10 tablet 3  . FLUoxetine (PROZAC) 20 MG capsule 20 mg.    . vitamin C (ASCORBIC ACID) 500 MG tablet Take 500 mg daily by mouth.    Marland Kitchen  VITAMIN D, ERGOCALCIFEROL, PO Take by mouth.     Current Facility-Administered Medications on File Prior to Visit  Medication Dose Route Frequency Provider Last Rate Last Admin  . 0.9 %  sodium chloride infusion  500 mL Intravenous Once Gatha Mayer, MD       No Known Allergies Family History  Problem Relation Age of Onset  . Hypertension Paternal Aunt   . Diabetes Paternal Aunt   . Heart disease Paternal Aunt        MI  . Hypertension Paternal Uncle   . Heart disease Paternal Uncle   . Hypertension Mother   . Hyperlipidemia Mother   . Cancer Mother   . Hypertension Father   . Diabetes Father   . Hyperlipidemia Father   . Colon cancer Neg Hx   . Stomach cancer Neg Hx    PE: BP 120/70   Pulse 68   Ht 5\' 4"  (1.626 m)   Wt 161 lb (73 kg)   SpO2 99%   BMI 27.64 kg/m  Wt Readings from Last 3 Encounters:  02/17/20 161 lb (73 kg)  08/19/19 160 lb (72.6 kg)  05/19/17 156 lb (70.8 kg)   Constitutional: normal weight, in NAD Eyes: PERRLA, EOMI, no exophthalmos ENT: moist mucous membranes, no thyromegaly, no cervical lymphadenopathy Cardiovascular: RRR, No MRG Respiratory: CTA B Gastrointestinal: abdomen soft, NT, ND, BS+ Musculoskeletal: no deformities, strength intact in all 4 Skin: moist, warm, no rashes Neurological: no  tremor with outstretched hands, DTR normal in all 4,  + L sided sciatica  ASSESSMENT: 1. Pituitary microadenoma  2.  Hyperprolactinemia  PLAN:  1. Patient with a history of pituitary microadenoma diagnosed during investigation for hyperprolactinemia in 06/2019. We discussed that this is a microrather than a macroadenoma since it measures less than 1 cm in the largest dimension. At last visit we checked her pituitary hormones and they were normal with exception of a high prolactin. We are now treating her hyperprolactinemia with cabergoline, with good results. -We discussed that the pituitary adenoma may regress in time and we may give her a drug holiday to Cabergoline in > or = 3 years after starting the medication. -She denies headaches, double vision or visual field cuts -Return in 1 year  2.  Hyperprolactinemia -We discussed at last visit that there were many possible causes for her hyperprolactinemia, however, in her case, the most likely cause of her small pituitary adenoma -We started her on cabergoline twice a week and prolactin decreased nicely so we were able to decrease the Cabergoline dose to only once a week in 09/2019 -At this visit, she does not complain of any dizziness, headaches, congestion, as possible side effects of Cabergoline -We will repeat her prolactin level - but we cannot do this today since she took a high dose of biotin yesterday.  We will have her off the biotin for about a week and then come back for labs.  We discussed that we may need to reduce the dose of Cabergoline at that time to 1 tablet every 2 weeks.  Orders Placed This Encounter  Procedures  . Prolactin   Component     Latest Ref Rng & Units 02/23/2020  Prolactin     ng/mL 7.8  Prolactin level is excellent.  We will continue the current dose of Cabergoline for now.  Philemon Kingdom, MD PhD [facility] Endocrinology

## 2020-02-23 ENCOUNTER — Other Ambulatory Visit: Payer: Self-pay

## 2020-02-23 ENCOUNTER — Other Ambulatory Visit (INDEPENDENT_AMBULATORY_CARE_PROVIDER_SITE_OTHER): Payer: Commercial Managed Care - PPO

## 2020-02-23 DIAGNOSIS — D497 Neoplasm of unspecified behavior of endocrine glands and other parts of nervous system: Secondary | ICD-10-CM

## 2020-02-23 DIAGNOSIS — E221 Hyperprolactinemia: Secondary | ICD-10-CM

## 2020-02-24 LAB — PROLACTIN: Prolactin: 7.8 ng/mL

## 2020-03-16 ENCOUNTER — Other Ambulatory Visit (HOSPITAL_COMMUNITY): Payer: Self-pay | Admitting: Family Medicine

## 2020-03-16 MED FILL — MELOXICAM 15 MG TABLET: 15 | 30 days supply | Qty: 30 | Fill #0

## 2020-04-04 MED FILL — FERREX 150 CAPSULE: 150 | 90 days supply | Qty: 90 | Fill #1

## 2020-05-18 MED FILL — CABERGOLINE 0.5 MG TABS: 0.5 | 28 days supply | Qty: 4 | Fill #1

## 2020-05-31 ENCOUNTER — Ambulatory Visit: Payer: Commercial Managed Care - PPO | Attending: Internal Medicine

## 2020-05-31 DIAGNOSIS — Z23 Encounter for immunization: Secondary | ICD-10-CM

## 2020-05-31 NOTE — Progress Notes (Signed)
   Covid-19 Vaccination Clinic  Name:  Mary Preston    MRN: [id number] DOB: 1970/09/04  05/31/2020  [name] was observed post Covid-19 immunization for 15 minutes without incident. She was provided with Vaccine Information Sheet and instruction to access the V-Safe system.   [name] was instructed to call 911 with any severe reactions post vaccine: Marland Kitchen Difficulty breathing  . Swelling of face and throat  . A fast heartbeat  . A bad rash all over body  . Dizziness and weakness   Immunizations Administered    Name Date Dose VIS Date Route   Pfizer COVID-19 Vaccine 05/31/2020  1:58 PM 0.3 mL 04/04/2020 Intramuscular   Manufacturer: [place]   Lot: [id number]   [place]: [id number]

## 2020-07-03 ENCOUNTER — Other Ambulatory Visit: Payer: Commercial Managed Care - PPO

## 2020-07-06 MED FILL — CABERGOLINE 0.5 MG TABS: 0.5 | 28 days supply | Qty: 4 | Fill #2

## 2020-08-08 ENCOUNTER — Other Ambulatory Visit (HOSPITAL_COMMUNITY): Payer: Self-pay | Admitting: Family Medicine

## 2020-08-08 MED FILL — FERREX 150 CAPSULE: 150 | 90 days supply | Qty: 90 | Fill #0

## 2020-09-04 ENCOUNTER — Other Ambulatory Visit (HOSPITAL_COMMUNITY): Payer: Self-pay

## 2020-09-04 MED FILL — SODIUM FLUORIDE 0.2 % SOLN: 0.2 | 30 days supply | Qty: 473 | Fill #0

## 2020-09-14 MED FILL — CABERGOLINE 0.5 MG TABS: 0.5 | 28 days supply | Qty: 4 | Fill #3

## 2020-10-15 ENCOUNTER — Other Ambulatory Visit (HOSPITAL_COMMUNITY): Payer: Self-pay

## 2020-10-15 MED FILL — Cabergoline Tab 0.5 MG: ORAL | 28 days supply | Qty: 4 | Fill #0 | Status: AC

## 2020-10-22 ENCOUNTER — Other Ambulatory Visit: Payer: Self-pay

## 2020-11-18 MED FILL — Cabergoline Tab 0.5 MG: ORAL | 28 days supply | Qty: 4 | Fill #1 | Status: AC

## 2020-11-19 ENCOUNTER — Other Ambulatory Visit (HOSPITAL_COMMUNITY): Payer: Self-pay

## 2021-01-18 MED FILL — Cabergoline Tab 0.5 MG: ORAL | 28 days supply | Qty: 2 | Fill #2 | Status: AC

## 2021-01-21 ENCOUNTER — Other Ambulatory Visit (HOSPITAL_COMMUNITY): Payer: Self-pay

## 2021-01-22 ENCOUNTER — Other Ambulatory Visit (HOSPITAL_COMMUNITY): Payer: Self-pay

## 2021-02-14 ENCOUNTER — Encounter: Payer: Self-pay | Admitting: Internal Medicine

## 2021-02-14 ENCOUNTER — Ambulatory Visit: Payer: Commercial Managed Care - PPO | Admitting: Internal Medicine

## 2021-02-14 ENCOUNTER — Other Ambulatory Visit: Payer: Self-pay

## 2021-02-14 VITALS — BP 120/88 | HR 68 | Ht 64.0 in | Wt 161.2 lb

## 2021-02-14 DIAGNOSIS — E221 Hyperprolactinemia: Secondary | ICD-10-CM | POA: Diagnosis not present

## 2021-02-14 DIAGNOSIS — D497 Neoplasm of unspecified behavior of endocrine glands and other parts of nervous system: Secondary | ICD-10-CM

## 2021-02-14 MED ORDER — CABERGOLINE 0.5 MG PO TABS: ORAL_TABLET | ORAL | 3 refills | Status: DC

## 2021-02-14 NOTE — Progress Notes (Signed)
Patient ID: Mary Preston, female   DOB: 1970-09-12, 50 y.o.   MRN: :[id number]   This visit occurred during the SARS-CoV-2 public health emergency.  Safety protocols were in place, including screening questions prior to the visit, additional usage of staff PPE, and extensive cleaning of exam room while observing appropriate contact time as indicated for disinfecting solutions.   HPI  Mary Preston is a 50 y.o.-year-old female, initially referred by her ObGyn Dr., Dr. Earnstine Preston, returning for follow-up for pituitary adenoma and hyperprolactinemia.  She is the niece of Mary Preston, who is also my patient. Last visit 1 year ago.  Interim history: She denies headaches, congestion, dizziness, nausea. Also, denies breast tension or discharge. She has regular menstrual cycles. No hot flushes.  She stopped biotin since last visit.  Patient was diagnosed with a pituitary adenoma in 2021 after she presented with 1 missed menstrual cycle, breast tenderness, and a little breast discharge, and also dizziness. At that time she was found to have a high prolactin level.  Reviewed the images and report of the pituitary MRI report (07/14/2019): 5 to 6 mm pituitary adenoma: Overall pituitary size andconfiguration is normal but on both dynamic and delayed post-contrast images there is a 5-6 millimeter nodular area of hypoenhancement along the central and caudal aspect of the gland slightly eccentric to the right (series 21, image 7 and series 22, image 5. The remainder of the gland enhances homogeneously. Normal pituitary infundibulum, suprasellar cistern, hypothalamus. The cavernous sinuses remain normal. No other abnormal enhancement.    Reviewed pituitary hormone levels:  Prolactin was high and ACTH was undetectably low while the rest of the pituitary hormones, including cortisol, were normal: Component     Latest Ref Rng & Units 08/19/2019  IGF-I, LC/MS     52 - 328 ng/mL 100  Z-Score  (Female)     -2.0 - 2 SD -0.7  Prolactin     ng/mL 51.4 (H)  TSH     0.35 - 4.50 uIU/mL 1.53  T4,Free(Direct)     0.60 - 1.60 ng/dL 0.85  Triiodothyronine,Free,Serum     2.3 - 4.2 pg/mL 3.6  Cortisol, Plasma     ug/dL 6.8  C206 ACTH     6 - 50 pg/mL <5 (L)   Since there was low suspicion for adrenal insufficiency, we repeated a cortisol and ACTH a month later and these returned normal and prolactin decreased to the low normal range: Component     Latest Ref Rng & Units 09/30/2019  Estradiol, Free     pg/mL 0.81  Estradiol     pg/mL 47  Prolactin     ng/mL 3.5  Cortisol, Plasma     ug/dL 5.6  C206 ACTH     6 - 50 pg/mL 12  FSH     mIU/ML 25.5  LH     mIU/mL 16.31   Previously: 08/08/2018: Prolactin 99.9 (4.8-23.3) 06/01/2019: Prolactin 101 (4.8-23.3), Urine pregnancy test negative  In 09/2019, we reduced Cabergoline 0.25 mg weekly. She continues on this dose now.    She was previously on Provera for 10 days in 05/2019 but she was able to stop afterwards. No breast discharge, had this previously with stimulation, not spontaneously. She has 2 children: 9 and 21 years old (08/2019).  She did not have problems with infertility or with breast-feeding.  She exercises by cardio 4-5 times a week.  ROS:  Constitutional: no weight gain/no weight loss, no fatigue, no subjective hyperthermia, no  subjective hypothermia Eyes: no blurry vision, no xerophthalmia ENT: no sore throat, no nodules palpated in neck, no dysphagia, no odynophagia, no hoarseness Cardiovascular: no CP/no SOB/no palpitations/no leg swelling Respiratory: no cough/no SOB/no wheezing Gastrointestinal: no N/no V/no D/no C/no acid reflux Musculoskeletal: no muscle aches/no joint aches Skin: no rashes, no hair loss Neurological: no tremors/no numbness/no tingling/no dizziness  I reviewed pt's medications, allergies, PMH, social hx, family hx, and changes were documented in the history of present illness.  Otherwise, unchanged from my initial visit note.  Past Medical History:  Diagnosis Date   Abdominal pain 1998   Abnormal Pap smear 07/31/04   CIN 1   Back pain 01/17/97   Depression 09/16/11   Fibroid 07/11/97   H/O constipation 01/26/1996   With rectal bleeding   H/O dysmenorrhea    H/O fatigue 01/17/97   H/O varicella    History of rectal bleeding 05/01/97   Hx of adenomatous colonic polyps 05/26/2017   Hx of irritable bowel syndrome 01/16/1999   Hypertension    08/02/09   Irregular periods/menstrual cycles 2006   LGSIL (low grade squamous intraepithelial dysplasia)    LLQ pain 02/03/2001   Past Surgical History:  Procedure Laterality Date   laposcopy     NO PAST SURGERIES     Social History   Socioeconomic History   Marital status: Single    Spouse name: Not on file   Number of children: 2   Years of education: Not on file   Highest education level: Not on file  Occupational History    Clinical coding analyst  Tobacco Use   Smoking status: Never Smoker   Smokeless tobacco: Never Used  Substance and Sexual Activity   Alcohol use: No   Drug use: No   Social Determinants of Health   Financial Resource Strain:    Difficulty of Paying Living Expenses: Not on file  Food Insecurity:    Worried About Running Out of Food in the Last Year: Not on file   [employer] of Food in the Last Year: Not on file  Transportation Needs:    Lack of Transportation (Medical): Not on file   Lack of Transportation (Non-Medical): Not on file  Physical Activity:    Days of Exercise per Week: Not on file   Minutes of Exercise per Session: Not on file  Stress:    Feeling of Stress : Not on file  Social Connections:    Frequency of Communication with Friends and Family: Not on file   Frequency of Social Gatherings with Friends and Family: Not on file   Attends Religious Services: Not on file   Active Member of Clubs or Organizations: Not on file   Attends Archivist Meetings: Not on  file   Marital Status: Not on file  Intimate Partner Violence:    Fear of Current or Ex-Partner: Not on file   Emotionally Abused: Not on file   Physically Abused: Not on file   Sexually Abused: Not on file   Current Outpatient Medications on File Prior to Visit  Medication Sig Dispense Refill   Biotin 1000 MCG tablet Take 1,000 mcg 3 (three) times daily by mouth.     cabergoline (DOSTINEX) 0.5 MG tablet TAKE 0.5 TABLETS (0.25 MG TOTAL) BY MOUTH ONCE A WEEK. 8 tablet 3   FLUoxetine (PROZAC) 20 MG capsule 20 mg. (Patient not taking: Reported on 02/17/2020)     iron polysaccharides (NIFEREX) 150 MG capsule Poly-Iron 150 mg iron capsule  TAKE 1 CAPSULE BY MOUTH ONCE DAILY     iron polysaccharides (NIFEREX) 150 MG capsule TAKE 1 CAPSULE BY MOUTH ONCE DAILY 90 capsule 0   iron polysaccharides (NIFEREX) 150 MG capsule TAKE 1 CAPSULE BY MOUTH ONCE DAILY 90 capsule 1   meloxicam (MOBIC) 15 MG tablet TAKE 1 TABLET BY MOUTH ONCE A DAY WITH FOOD FOR 2 WEEKS THEN AS NEEDED 30 tablet 0   SODIUM FLUORIDE, DENTAL RINSE, 0.2 % SOLN USE AS AN ORAL RINSE, AS DIRECTED ON PACKAGE 473 mL 5   vitamin C (ASCORBIC ACID) 500 MG tablet Take 500 mg daily by mouth.     VITAMIN D, ERGOCALCIFEROL, PO Take by mouth.     Current Facility-Administered Medications on File Prior to Visit  Medication Dose Route Frequency Provider Last Rate Last Admin   0.9 %  sodium chloride infusion  500 mL Intravenous Once Gatha Mayer, MD       No Known Allergies Family History  Problem Relation Age of Onset   Hypertension Paternal Aunt    Diabetes Paternal Aunt    Heart disease Paternal Aunt        MI   Hypertension Paternal Uncle    Heart disease Paternal Uncle    Hypertension Mother    Hyperlipidemia Mother    Cancer Mother    Hypertension Father    Diabetes Father    Hyperlipidemia Father    Colon cancer Neg Hx    Stomach cancer Neg Hx    PE: BP 120/88 (BP Location: Right Arm, Patient Position: Sitting, Cuff Size:  Normal)   Pulse 68   Ht '5\' 4"'$  (1.626 m)   Wt 161 lb 3.2 oz (73.1 kg)   SpO2 99%   BMI 27.67 kg/m  Wt Readings from Last 3 Encounters:  02/14/21 161 lb 3.2 oz (73.1 kg)  02/17/20 161 lb (73 kg)  08/19/19 160 lb (72.6 kg)   Constitutional: normal weight, in NAD Eyes: PERRLA, EOMI, no exophthalmos ENT: moist mucous membranes, no thyromegaly, no cervical lymphadenopathy Cardiovascular: RRR, No MRG Respiratory: CTA B Gastrointestinal: abdomen soft, NT, ND, BS+ Musculoskeletal: no deformities, strength intact in all 4 Skin: moist, warm, no rashes Neurological: no tremor with outstretched hands, DTR normal in all 4  ASSESSMENT: 1. Pituitary microadenoma  2.  Hyperprolactinemia  PLAN:  1. Patient with a history of pituitary microadenoma, diagnosed during investigation for hyperprolactinemia in 06/2019.  We discussed that this is a micro, rather than a macroadenoma since it measured less than 1 cm in the largest dimension. -Pituitary hormone investigation was normal with the exception of the high prolactin.  We are now treating this with Cabergoline, with good results -We discussed that small pituitary adenomas may regress in time and we may give her a drug holiday from Cabergoline 3 to 5 years after starting the medicine -She denies headaches, double vision, or other visual problems -No need to repeat the pituitary MRI for now unless prolactin level starts to increase  2.  Hyperprolactinemia -We discussed at previous visits that there were many possible causes for her hyperprolactinemia, however, in her case, the most likely cause is her small pituitary adenoma -We started her initially on Cabergoline twice a week and prolactin decreased nicely so we were able to decrease the Cabergoline dose to only once a week in 09/2019 -She denies dizziness, headaches, congestion, nausea, as possible side effects of Cabergoline -At last visit we repeated her prolactin level and it was excellent, at  7.8.  At that time, we discussed that in the near future, we may need to reduce the dose of Cabergoline to 1 tablet every 2 weeks.  However, at that time, we continued the same dose. -We will repeat a prolactin level today.  Of note, she was on biotin but she stopped this since last OV. -At this visit, I gave her written prescription for Cabergoline to take it to [facility] for a better price.  However, I advised her that we may need to decrease the dose after the results returned today. -We will see her back in a year  Orders Placed This Encounter  Procedures   Prolactin   Component     Latest Ref Rng & Units 02/14/2021  Prolactin     ng/mL 7.3  At this point, we can try to space out her Cabergoline dose: Half a tablet every second week. I will advise him to return for labs in ~4 months.  Philemon Kingdom, MD PhD [facility] Endocrinology

## 2021-02-14 NOTE — Patient Instructions (Addendum)
Please stop at the lab.  Please continue Cabergoline 0.25 mg weekly.  Please come back for a follow-up appointment in 1 year.

## 2021-02-15 LAB — PROLACTIN: Prolactin: 7.3 ng/mL

## 2021-02-15 MED ORDER — CABERGOLINE 0.5 MG PO TABS: ORAL_TABLET | ORAL | 3 refills | Status: DC

## 2021-04-02 ENCOUNTER — Other Ambulatory Visit (HOSPITAL_COMMUNITY): Payer: Self-pay

## 2021-04-02 MED ORDER — POLYSACCHARIDE IRON COMPLEX 150 MG PO CAPS
150.0000 mg | ORAL_CAPSULE | Freq: Every day | ORAL | 0 refills | Status: DC
  Filled 2021-04-02: qty 90, 90d supply, fill #0
  Filled 2021-04-02: qty 30, 30d supply, fill #0

## 2021-05-02 ENCOUNTER — Other Ambulatory Visit (HOSPITAL_COMMUNITY): Payer: Self-pay

## 2021-05-02 MED ORDER — HYDROCORTISONE 2.5 % EX OINT
1.0000 "application " | TOPICAL_OINTMENT | Freq: Two times a day (BID) | CUTANEOUS | 5 refills | Status: AC | PRN
  Filled 2021-05-02: qty 28.35, 14d supply, fill #0

## 2021-05-13 ENCOUNTER — Other Ambulatory Visit (HOSPITAL_COMMUNITY): Payer: Self-pay

## 2021-06-19 ENCOUNTER — Other Ambulatory Visit (INDEPENDENT_AMBULATORY_CARE_PROVIDER_SITE_OTHER): Payer: Commercial Managed Care - PPO

## 2021-06-19 ENCOUNTER — Other Ambulatory Visit: Payer: Self-pay

## 2021-06-19 DIAGNOSIS — E221 Hyperprolactinemia: Secondary | ICD-10-CM

## 2021-06-19 DIAGNOSIS — D497 Neoplasm of unspecified behavior of endocrine glands and other parts of nervous system: Secondary | ICD-10-CM

## 2021-06-20 LAB — PROLACTIN: Prolactin: 7.4 ng/mL

## 2021-10-22 ENCOUNTER — Other Ambulatory Visit (INDEPENDENT_AMBULATORY_CARE_PROVIDER_SITE_OTHER): Payer: Commercial Managed Care - PPO

## 2021-10-22 DIAGNOSIS — D497 Neoplasm of unspecified behavior of endocrine glands and other parts of nervous system: Secondary | ICD-10-CM

## 2021-10-22 DIAGNOSIS — E221 Hyperprolactinemia: Secondary | ICD-10-CM

## 2021-10-23 LAB — PROLACTIN: Prolactin: 14.7 ng/mL

## 2021-11-04 ENCOUNTER — Other Ambulatory Visit: Payer: Self-pay | Admitting: Obstetrics and Gynecology

## 2021-11-04 DIAGNOSIS — R928 Other abnormal and inconclusive findings on diagnostic imaging of breast: Secondary | ICD-10-CM

## 2021-11-13 ENCOUNTER — Ambulatory Visit: Admission: RE | Admit: 2021-11-13 | Payer: Commercial Managed Care - PPO | Source: Ambulatory Visit

## 2021-11-13 ENCOUNTER — Ambulatory Visit
Admission: RE | Admit: 2021-11-13 | Discharge: 2021-11-13 | Disposition: A | Discharge status: Home / Self Care | Payer: Commercial Managed Care - PPO | Source: Ambulatory Visit | Attending: Obstetrics and Gynecology | Admitting: Obstetrics and Gynecology

## 2021-11-13 DIAGNOSIS — R928 Other abnormal and inconclusive findings on diagnostic imaging of breast: Secondary | ICD-10-CM

## 2022-02-19 ENCOUNTER — Encounter: Payer: Self-pay | Admitting: Internal Medicine

## 2022-02-19 ENCOUNTER — Ambulatory Visit: Payer: Commercial Managed Care - PPO | Admitting: Internal Medicine

## 2022-02-19 VITALS — BP 110/70 | HR 69 | Ht 64.0 in | Wt 167.0 lb

## 2022-02-19 DIAGNOSIS — E221 Hyperprolactinemia: Secondary | ICD-10-CM

## 2022-02-19 DIAGNOSIS — D497 Neoplasm of unspecified behavior of endocrine glands and other parts of nervous system: Secondary | ICD-10-CM | POA: Diagnosis not present

## 2022-02-19 NOTE — Progress Notes (Signed)
Patient ID: Mary Preston, female   DOB: 10-01-1970, 51 y.o.   MRN: [id number]   HPI  Mary Preston is a 51 y.o.-year-old female, initially referred by her ObGyn Dr., Dr. Earnstine Regal, returning for follow-up for pituitary adenoma and hyperprolactinemia.  She is the niece of [name], who is also my patient. Last visit 1 year ago.  Interim history: She denies headaches or vision pbs. Also, denies breast tension or discharge.  She previously had stimulated breast discharge, but this resolved. Has regular menstrual cycles - every month but at different intervals.  No hot flashes, but feels warm.  Patient was diagnosed with a pituitary adenoma in 2021 after she presented with 1 missed menstrual cycle, breast tenderness, and a little breast discharge, and also dizziness. At that time she was found to have a high prolactin level.  Reviewed the images and report of the pituitary MRI report (07/14/2019): 5 to 6 mm pituitary adenoma: Overall pituitary size andconfiguration is normal but on both dynamic and delayed post-contrast images there is a 5-6 millimeter nodular area of hypoenhancement along the central and caudal aspect of the gland slightly eccentric to the right (series 21, image 7 and series 22, image 5. The remainder of the gland enhances homogeneously. Normal pituitary infundibulum, suprasellar cistern, hypothalamus. The cavernous sinuses remain normal. No other abnormal enhancement.    Reviewed pituitary hormone levels:  Prolactin was high and ACTH was undetectably low while the rest of the pituitary hormones, including cortisol, were normal: Component     Latest Ref Rng & Units 08/19/2019  IGF-I, LC/MS     52 - 328 ng/mL 100  Z-Score (Female)     -2.0 - 2 SD -0.7  Prolactin     ng/mL 51.4 (H)  TSH     0.35 - 4.50 uIU/mL 1.53  T4,Free(Direct)     0.60 - 1.60 ng/dL 0.85  Triiodothyronine,Free,Serum     2.3 - 4.2 pg/mL 3.6  Cortisol, Plasma     ug/dL 6.8  C206  ACTH     6 - 50 pg/mL <5 (L)   Since there was low suspicion for adrenal insufficiency, we repeated a cortisol and ACTH a month later and these returned normal and prolactin decreased to the low normal range: Component     Latest Ref Rng & Units 09/30/2019  Estradiol, Free     pg/mL 0.81  Estradiol     pg/mL 47  Prolactin     ng/mL 3.5  Cortisol, Plasma     ug/dL 5.6  C206 ACTH     6 - 50 pg/mL 12  FSH     mIU/ML 25.5  LH     mIU/mL 16.31   Previously: 08/08/2018: Prolactin 99.9 (4.8-23.3) 06/01/2019: Prolactin 101 (4.8-23.3), Urine pregnancy test negative  In 09/2019, we reduced Cabergoline 0.25 mg weekly.  In 02/2021, we reduced Cabergoline to 0.25 mg every other week.   In 06/2021, we stopped Cabergoline.  Lab Results  Component Value Date   PROLACTIN 14.7 10/22/2021   PROLACTIN 7.4 06/19/2021   PROLACTIN 7.3 02/14/2021   PROLACTIN 7.8 02/23/2020   PROLACTIN 3.5 09/30/2019   PROLACTIN 51.4 (H) 08/19/2019   She was previously on Provera for 10 days in 05/2019 but she was able to stop afterwards.  She has 2 children: 50 and 52 years old (08/2019).  She did not have problems with infertility or with breast-feeding.  She exercises by cardio 4-5 times a week.  ROS:  + See HPI  I reviewed pt's medications, allergies, PMH, social hx, family hx, and changes were documented in the history of present illness. Otherwise, unchanged from my initial visit note.  Past Medical History:  Diagnosis Date   Abdominal pain 1998   Abnormal Pap smear 07/31/04   CIN 1   Back pain 01/17/97   Depression 09/16/11   Fibroid 07/11/97   H/O constipation 01/26/1996   With rectal bleeding   H/O dysmenorrhea    H/O fatigue 01/17/97   H/O varicella    History of rectal bleeding 05/01/97   Hx of adenomatous colonic polyps 05/26/2017   Hx of irritable bowel syndrome 01/16/1999   Hypertension    08/02/09   Irregular periods/menstrual cycles 2006   LGSIL (low grade squamous intraepithelial  dysplasia)    LLQ pain 02/03/2001   Past Surgical History:  Procedure Laterality Date   laposcopy     NO PAST SURGERIES     Social History   Socioeconomic History   Marital status: Single    Spouse name: Not on file   Number of children: 2   Years of education: Not on file   Highest education level: Not on file  Occupational History    Clinical coding analyst  Tobacco Use   Smoking status: Never Smoker   Smokeless tobacco: Never Used  Substance and Sexual Activity   Alcohol use: No   Drug use: No   Social Determinants of Health   Financial Resource Strain:    Difficulty of Paying Living Expenses: Not on file  Food Insecurity:    Worried About Running Out of Food in the Last Year: Not on file   [employer] of Food in the Last Year: Not on file  Transportation Needs:    Lack of Transportation (Medical): Not on file   Lack of Transportation (Non-Medical): Not on file  Physical Activity:    Days of Exercise per Week: Not on file   Minutes of Exercise per Session: Not on file  Stress:    Feeling of Stress : Not on file  Social Connections:    Frequency of Communication with Friends and Family: Not on file   Frequency of Social Gatherings with Friends and Family: Not on file   Attends Religious Services: Not on file   Active Member of Clubs or Organizations: Not on file   Attends Archivist Meetings: Not on file   Marital Status: Not on file  Intimate Partner Violence:    Fear of Current or Ex-Partner: Not on file   Emotionally Abused: Not on file   Physically Abused: Not on file   Sexually Abused: Not on file   Current Outpatient Medications on File Prior to Visit  Medication Sig Dispense Refill   Biotin 1000 MCG tablet Take 1,000 mcg 3 (three) times daily by mouth.     FLUoxetine (PROZAC) 20 MG capsule 20 mg. (Patient not taking: No sig reported)     hydrocortisone 2.5 % ointment Apply 1 application topically 2 (two) times daily as needed. 28.35 g 5   iron  polysaccharides (FERREX 150) 150 MG capsule Take 1 capsule (150 mg total) by mouth daily. 90 capsule 0   iron polysaccharides (NIFEREX) 150 MG capsule Poly-Iron 150 mg iron capsule  TAKE 1 CAPSULE BY MOUTH ONCE DAILY     iron polysaccharides (NIFEREX) 150 MG capsule TAKE 1 CAPSULE BY MOUTH ONCE DAILY 90 capsule 0   iron polysaccharides (NIFEREX) 150 MG capsule TAKE 1 CAPSULE BY MOUTH ONCE DAILY  90 capsule 1   vitamin C (ASCORBIC ACID) 500 MG tablet Take 500 mg daily by mouth.     VITAMIN D, ERGOCALCIFEROL, PO Take by mouth.     Current Facility-Administered Medications on File Prior to Visit  Medication Dose Route Frequency Provider Last Rate Last Admin   0.9 %  sodium chloride infusion  500 mL Intravenous Once Gatha Mayer, MD       No Known Allergies Family History  Problem Relation Age of Onset   Hypertension Paternal Aunt    Diabetes Paternal Aunt    Heart disease Paternal Aunt        MI   Hypertension Paternal Uncle    Heart disease Paternal Uncle    Hypertension Mother    Hyperlipidemia Mother    Cancer Mother    Hypertension Father    Diabetes Father    Hyperlipidemia Father    Colon cancer Neg Hx    Stomach cancer Neg Hx    PE: BP 110/70 (BP Location: Left Arm, Patient Position: Sitting, Cuff Size: Small)   Pulse 69   Ht '5\' 4"'$  (1.626 m)   Wt 167 lb (75.8 kg)   SpO2 99%   BMI 28.67 kg/m  Wt Readings from Last 3 Encounters:  02/19/22 167 lb (75.8 kg)  02/14/21 161 lb 3.2 oz (73.1 kg)  02/17/20 161 lb (73 kg)   Constitutional: Normal weight, in NAD Eyes: no exophthalmos ENT: moist mucous membranes, no masses palpated in neck, no cervical lymphadenopathy Cardiovascular: RRR, No MRG Respiratory: CTA B Musculoskeletal: no deformities Skin: moist, warm, no rashes Neurological: no tremor with outstretched hands  ASSESSMENT: 1. Pituitary microadenoma  2.  Hyperprolactinemia  PLAN:  1. Patient with a history of a pituitary microadenoma, diagnosed during  investigation for hyperprolactinemia in 06/2019.  Since the tumor was measuring less than 1 cm, this is a micro, rather than a macroadenoma. -Pituitary hormone investigation was normal with the exception of the high prolactin.  She was started on Cabergoline. -At last visit, her prolactin level was very well controlled so I advised her to decrease her Cabergoline dose to 0.25 mg every other week.  We discussed about the plan to give her a drug holiday 3 to 5 years after starting it if the prolactin levels remain controlled.  We did end up stopping Cabergoline in 06/2021.  At that time, prolactin was stable, at 7.4. -After decreasing the dose of Cabergoline, her prolactin level was slightly high, but still well within the target range, at 14.7 -At today's visit, she denies headaches, double vision or other visual problems, and also galactorrhea or breast tenderness. She gained 6 lbs since last OV - muscle mass -We will recheck her prolactin level.  If this is not elevated, we will continue without Cabergoline -No need to repeat the pituitary MRI unless prolactin level increases significantly.  2.  Hyperprolactinemia -We discussed at previous visits that there were many possible causes for her hyperprolactinemia, but in her case the most likely cause is her small pituitary adenoma -She denies galactorrhea or breast tenderness -At today's visit we will recheck her prolactin level as mentioned above. -She does agree to restart Cabergoline if needed  Component     Latest Ref Rng 02/19/2022  Prolactin     ng/mL 19.2    PRL is only slightly higher than before, but still within the normal range. I plan to recheck this in 6 months, but no intervention is needed for now.  Philemon Kingdom,  MD PhD [facility]

## 2022-02-19 NOTE — Patient Instructions (Addendum)
Please stop at the lab.  Please continue off Cabergoline.  Please come back for a follow-up appointment in 1 year, but possibly sooner for labs.

## 2022-02-20 LAB — PROLACTIN: Prolactin: 19.2 ng/mL

## 2022-03-25 ENCOUNTER — Encounter: Payer: Self-pay | Admitting: Internal Medicine

## 2022-05-02 ENCOUNTER — Other Ambulatory Visit: Payer: Self-pay

## 2022-05-02 ENCOUNTER — Ambulatory Visit (AMBULATORY_SURGERY_CENTER): Payer: Self-pay

## 2022-05-02 VITALS — Ht 64.0 in | Wt 170.2 lb

## 2022-05-02 DIAGNOSIS — Z86010 Personal history of colonic polyps: Secondary | ICD-10-CM

## 2022-05-02 MED ORDER — NA SULFATE-K SULFATE-MG SULF 17.5-3.13-1.6 GM/177ML PO SOLN: 1.0000 | Freq: Once | ORAL | 0 refills | Status: AC

## 2022-05-02 NOTE — Progress Notes (Signed)
Denies allergies to eggs or soy products. Denies complication of anesthesia or sedation. Denies use of weight loss medication. Denies use of O2.   Emmi instructions given for colonoscopy.  

## 2022-05-21 ENCOUNTER — Encounter: Payer: Self-pay | Admitting: Internal Medicine

## 2022-05-23 ENCOUNTER — Ambulatory Visit: Payer: Commercial Managed Care - PPO | Admitting: Internal Medicine

## 2022-05-23 ENCOUNTER — Encounter: Payer: Self-pay | Admitting: Internal Medicine

## 2022-05-23 ENCOUNTER — Telehealth: Payer: Self-pay | Admitting: *Deleted

## 2022-05-23 VITALS — BP 122/79 | HR 81 | Temp 97.5°F | Ht 64.0 in | Wt 170.2 lb

## 2022-05-23 DIAGNOSIS — Z86010 Personal history of colonic polyps: Secondary | ICD-10-CM

## 2022-05-23 MED ORDER — SODIUM CHLORIDE 0.9 % IV SOLN: 500.0000 mL | Freq: Once | INTRAVENOUS | Status: DC

## 2022-05-23 NOTE — Progress Notes (Signed)
Pt's states no medical or surgical changes since previsit or office visit. 

## 2022-05-23 NOTE — Progress Notes (Signed)
Patient needs to be rescheduled due to taking phentermine every day for the last several weeks.She was prescribed phentermine post PV by another doctor and was unaware that we needed her to sop before the procedure.  Dr. Carlean Purl and CRNA aware. Patient scheduled for Dec 15 at 2:15pm.

## 2022-05-23 NOTE — Telephone Encounter (Signed)
Pt was taking phentermine started after her Pre visit. She was rescheduled for 05/30/22. Miralax prep instructions reviewed with patient.

## 2022-05-30 ENCOUNTER — Ambulatory Visit (AMBULATORY_SURGERY_CENTER): Payer: Commercial Managed Care - PPO | Admitting: Internal Medicine

## 2022-05-30 ENCOUNTER — Encounter: Payer: Self-pay | Admitting: Internal Medicine

## 2022-05-30 VITALS — BP 123/87 | HR 68 | Temp 98.4°F | Resp 11 | Ht 64.0 in | Wt 170.2 lb

## 2022-05-30 DIAGNOSIS — Z86010 Personal history of colonic polyps: Secondary | ICD-10-CM | POA: Diagnosis not present

## 2022-05-30 DIAGNOSIS — D125 Benign neoplasm of sigmoid colon: Secondary | ICD-10-CM

## 2022-05-30 DIAGNOSIS — Z09 Encounter for follow-up examination after completed treatment for conditions other than malignant neoplasm: Secondary | ICD-10-CM

## 2022-05-30 MED ORDER — SODIUM CHLORIDE 0.9 % IV SOLN: 500.0000 mL | Freq: Once | INTRAVENOUS | Status: DC

## 2022-05-30 NOTE — Progress Notes (Signed)
Called to room to assist during endoscopic procedure.  Patient ID and intended procedure confirmed with present staff. Received instructions for my participation in the procedure from the performing physician.  

## 2022-05-30 NOTE — Progress Notes (Signed)
Pt's states no medical or surgical changes since previsit or office visit. 

## 2022-05-30 NOTE — Progress Notes (Signed)
A and O x3. Report to RN. Tolerated MAC anesthesia well. 

## 2022-05-30 NOTE — Patient Instructions (Addendum)
I found and removed one tiny polyp. I will let you know pathology results and when to have another routine colonoscopy by mail and/or My Chart.  Please resume normal diet and medications.  I appreciate the opportunity to care for you. Gatha Mayer, MD, FACG   YOU HAD AN ENDOSCOPIC PROCEDURE TODAY AT [facility]:   Refer to the procedure report that was given to you for any specific questions about what was found during the examination.  If the procedure report does not answer your questions, please call your gastroenterologist to clarify.  If you requested that your care partner not be given the details of your procedure findings, then the procedure report has been included in a sealed envelope for you to review at your convenience later.  YOU SHOULD EXPECT: Some feelings of bloating in the abdomen. Passage of more gas than usual.  Walking can help get rid of the air that was put into your GI tract during the procedure and reduce the bloating. If you had a lower endoscopy (such as a colonoscopy or flexible sigmoidoscopy) you may notice spotting of blood in your stool or on the toilet paper. If you underwent a bowel prep for your procedure, you may not have a normal bowel movement for a few days.  Please Note:  You might notice some irritation and congestion in your nose or some drainage.  This is from the oxygen used during your procedure.  There is no need for concern and it should clear up in a day or so.  SYMPTOMS TO REPORT IMMEDIATELY:  Following lower endoscopy (colonoscopy or flexible sigmoidoscopy):  Excessive amounts of blood in the stool  Significant tenderness or worsening of abdominal pains  Swelling of the abdomen that is new, acute  Fever of 100F or higher    For urgent or emergent issues, a gastroenterologist can be reached at any hour by calling [phone]. Do not use MyChart messaging for urgent concerns.    DIET:  We do recommend a small meal at  first, but then you may proceed to your regular diet.  Drink plenty of fluids but you should avoid alcoholic beverages for 24 hours.  ACTIVITY:  You should plan to take it easy for the rest of today and you should NOT DRIVE or use heavy machinery until tomorrow (because of the sedation medicines used during the test).    FOLLOW UP: Our staff will call the number listed on your records the next business day following your procedure.  We will call around 7:15- 8:00 am to check on you and address any questions or concerns that you may have regarding the information given to you following your procedure. If we do not reach you, we will leave a message.     If any biopsies were taken you will be contacted by phone or by letter within the next 1-3 weeks.  Please call us at [phone] if you have not heard about the biopsies in 3 weeks.    SIGNATURES/CONFIDENTIALITY: You and/or your care partner have signed paperwork which will be entered into your electronic medical record.  These signatures attest to the fact that that the information above on your After Visit Summary has been reviewed and is understood.  Full responsibility of the confidentiality of this discharge information lies with you and/or your care-partner.

## 2022-05-30 NOTE — Progress Notes (Signed)
[facility] History and Physical   Primary Care Physician:  [name], MD   Reason for Procedure:   Hx colon polyps  Plan:    colonoscopy     HPI: Mary Preston is a 51 y.o. female   2 diminutive adenomas removed 05/2017  Past Medical History:  Diagnosis Date   Abdominal pain 1998   Abnormal Pap smear 07/31/2004   CIN 1   Anemia    Back pain 01/17/1997   Depression 09/16/2011   Fibroid 07/11/1997   H/O constipation 01/26/1996   With rectal bleeding   H/O dysmenorrhea    H/O fatigue 01/17/1997   H/O varicella    History of rectal bleeding 05/01/1997   Hx of adenomatous colonic polyps 05/26/2017   Hx of irritable bowel syndrome 01/16/1999   Hypertension    08/02/09   Irregular periods/menstrual cycles 2006   LGSIL (low grade squamous intraepithelial dysplasia)    LLQ pain 02/03/2001    Past Surgical History:  Procedure Laterality Date   laposcopy     NO PAST SURGERIES      Prior to Admission medications   Medication Sig Start Date End Date Taking? Authorizing Provider  hydrocortisone 2.5 % ointment Apply 1 application topically 2 (two) times daily as needed. 05/02/21     iron polysaccharides (FERREX 150) 150 MG capsule Take 1 capsule (150 mg total) by mouth daily. 04/01/21     LINZESS 145 MCG CAPS capsule Take 145 mcg by mouth as needed. Usually every 2 or three days. 02/26/22   [provider]  phentermine (ADIPEX-P) 37.5 MG tablet Take 18.75-37.5 mg by mouth every morning. 05/09/22   [provider]  vitamin C (ASCORBIC ACID) 500 MG tablet Take 500 mg daily by mouth.    [provider]  VITAMIN D, ERGOCALCIFEROL, PO Take by mouth.    [provider]    Current Outpatient Medications  Medication Sig Dispense Refill   hydrocortisone 2.5 % ointment Apply 1 application topically 2 (two) times daily as needed. 28.35 g 5   iron polysaccharides (FERREX 150) 150 MG capsule Take 1 capsule (150 mg total) by mouth  daily. 90 capsule 0   LINZESS 145 MCG CAPS capsule Take 145 mcg by mouth as needed. Usually every 2 or three days.     phentermine (ADIPEX-P) 37.5 MG tablet Take 18.75-37.5 mg by mouth every morning.     vitamin C (ASCORBIC ACID) 500 MG tablet Take 500 mg daily by mouth.     VITAMIN D, ERGOCALCIFEROL, PO Take by mouth.     Current Facility-Administered Medications  Medication Dose Route Frequency Provider Last Rate Last Admin   0.9 %  sodium chloride infusion  500 mL Intravenous Once Gatha Mayer, MD        Allergies as of 05/30/2022   (No Known Allergies)    Family History  Problem Relation Age of Onset   Hypertension Mother    Hyperlipidemia Mother    Cancer Mother    Hypertension Father    Diabetes Father    Hyperlipidemia Father    Hypertension Paternal Aunt    Diabetes Paternal Aunt    Heart disease Paternal Aunt        MI   Hypertension Paternal Uncle    Heart disease Paternal Uncle    Colon cancer Neg Hx    Stomach cancer Neg Hx    Esophageal cancer Neg Hx    Rectal cancer Neg Hx     Social  History   Socioeconomic History   Marital status: Single    Spouse name: Not on file   Number of children: Not on file   Years of education: Not on file   Highest education level: Not on file  Occupational History   Not on file  Tobacco Use   Smoking status: Never   Smokeless tobacco: Never  Vaping Use   Vaping Use: Never used  Substance and Sexual Activity   Alcohol use: No   Drug use: No   Sexual activity: Yes    Partners: Male    Birth control/protection: None  Other Topics Concern   Not on file  Social History Narrative   Not on file   Social Determinants of Health   Financial Resource Strain: Not on file  Food Insecurity: Not on file  Transportation Needs: Not on file  Physical Activity: Not on file  Stress: Not on file  Social Connections: Not on file  Intimate Partner Violence: Not on file    Review of Systems:  All other review of systems  negative except as mentioned in the HPI.  Physical Exam: Vital signs BP 131/78   Pulse 72   Temp 98.4 F (36.9 C) (Temporal)   Ht '5\' 4"'$  (1.626 m)   Wt 170 lb 3.2 oz (77.2 kg)   LMP 05/29/2022 (Exact Date)   SpO2 100%   BMI 29.21 kg/m   General:   Alert,  Well-developed, well-nourished, pleasant and cooperative in NAD Lungs:  Clear throughout to auscultation.   Heart:  Regular rate and rhythm; no murmurs, clicks, rubs,  or gallops. Abdomen:  Soft, nontender and nondistended. Normal bowel sounds.   Neuro/Psych:  Alert and cooperative. Normal mood and affect. A and O x 3   '@Michaeline Eckersley'$  Simonne Maffucci, MD, [facility] [phone] (pager) 05/30/2022 2:03 PM@

## 2022-05-30 NOTE — Op Note (Signed)
[facility] Patient Name: Mary Preston Procedure Date: 05/30/2022 2:02 PM MRN: [id number] Endoscopist: Gatha Mayer , MD, [id number] Age: 51 Referring MD:  Date of Birth: Oct 12, 1970 Gender: Female Account #: [id number] Procedure:                Colonoscopy Indications:              Surveillance: Personal history of adenomatous                            polyps on last colonoscopy 5 years ago, Last                            colonoscopy: December 2018 Medicines:                Monitored Anesthesia Care Procedure:                Pre-Anesthesia Assessment:                           - Prior to the procedure, a History and Physical                            was performed, and patient medications and                            allergies were reviewed. The patient's tolerance of                            previous anesthesia was also reviewed. The risks                            and benefits of the procedure and the sedation                            options and risks were discussed with the patient.                            All questions were answered, and informed consent                            was obtained. Prior Anticoagulants: The patient has                            taken no anticoagulant or antiplatelet agents. ASA                            Grade Assessment: II - A patient with mild systemic                            disease. After reviewing the risks and benefits,                            the patient was deemed in satisfactory condition to  undergo the procedure.                           After obtaining informed consent, the colonoscope                            was passed under direct vision. Throughout the                            procedure, the patient's blood pressure, pulse, and                            oxygen saturations were monitored continuously. The                            PCF-HQ190L Colonoscope was introduced  through the                            anus and advanced to the the cecum, identified by                            appendiceal orifice and ileocecal valve. The                            colonoscopy was performed without difficulty. The                            patient tolerated the procedure well. The quality                            of the bowel preparation was good. The ileocecal                            valve, appendiceal orifice, and rectum were                            photographed. The bowel preparation used was                            Miralax via split dose instruction. Scope In: 2:10:34 PM Scope Out: 2:28:11 PM Scope Withdrawal Time: 0 hours 10 minutes 58 seconds  Total Procedure Duration: 0 hours 17 minutes 37 seconds  Findings:                 The perianal and digital rectal examinations were                            normal.                           A diminutive polyp was found in the sigmoid colon.                            The polyp was sessile. The polyp was removed with a  cold snare. Resection and retrieval were complete.                            Verification of patient identification for the                            specimen was done. Estimated blood loss was minimal.                           The exam was otherwise without abnormality on                            direct and retroflexion views. Complications:            No immediate complications. Estimated Blood Loss:     Estimated blood loss was minimal. Impression:               - One diminutive polyp in the sigmoid colon,                            removed with a cold snare. Resected and retrieved.                           - The examination was otherwise normal on direct                            and retroflexion views.                           - Personal history of colonic polyps - 05/2017 2                            diminutive adenomas. Recommendation:           -  Patient has a contact number available for                            emergencies. The signs and symptoms of potential                            delayed complications were discussed with the                            patient. Return to normal activities tomorrow.                            Written discharge instructions were provided to the                            patient.                           - Resume previous diet.                           - Continue present medications.                           -  Repeat colonoscopy is recommended. The                            colonoscopy date will be determined after pathology                            results from today's exam become available for                            review. Gatha Mayer, MD 05/30/2022 2:36:19 PM This report has been signed electronically.

## 2022-06-02 ENCOUNTER — Telehealth: Payer: Self-pay | Admitting: *Deleted

## 2022-06-02 NOTE — Telephone Encounter (Signed)
  Follow up Call-     05/30/2022    1:21 PM 05/23/2022    8:11 AM  Call back number  Post procedure Call Back phone  # [phone]  Permission to leave phone message Yes Yes     Patient questions:  Do you have a fever, pain , or abdominal swelling? No. Pain Score  0 *  Have you tolerated food without any problems? Yes.    Have you been able to return to your normal activities? Yes.    Do you have any questions about your discharge instructions: Diet   No. Medications  No. Follow up visit  No.  Do you have questions or concerns about your Care? No.  Actions: * If pain score is 4 or above: No action needed, pain <4.

## 2022-06-07 ENCOUNTER — Encounter: Payer: Self-pay | Admitting: Internal Medicine

## 2022-07-18 ENCOUNTER — Ambulatory Visit: Payer: BC Managed Care – PPO | Admitting: Internal Medicine

## 2022-07-18 ENCOUNTER — Encounter: Payer: Self-pay | Admitting: Internal Medicine

## 2022-07-18 VITALS — BP 130/70 | HR 78 | Ht 64.0 in | Wt 166.0 lb

## 2022-07-18 DIAGNOSIS — R1032 Left lower quadrant pain: Secondary | ICD-10-CM

## 2022-07-18 DIAGNOSIS — D219 Benign neoplasm of connective and other soft tissue, unspecified: Secondary | ICD-10-CM

## 2022-07-18 DIAGNOSIS — K5909 Other constipation: Secondary | ICD-10-CM

## 2022-07-18 DIAGNOSIS — G8929 Other chronic pain: Secondary | ICD-10-CM | POA: Diagnosis not present

## 2022-07-18 NOTE — Progress Notes (Signed)
Mary Preston 52 y.o. Mar 20, 1971 [id number]  Assessment & Plan:   Encounter Diagnoses  Name Primary?   Chronic constipation Yes   Chronic LLQ pain    Fibroids    Continue with as needed Dulcolax a few times a week.  Refer to physical therapy for consideration of pelvic floor physical therapy to treat constipation and help left lower quadrant/groin pain if possible.  It seems unlikely fibroids would cause this pain but I wonder.  CC: [name], MD    Subjective:   Chief Complaint: Chronic abdominal pain and constipation  HPI 52 year old African-American woman with history of chronic constipation for many years and a 20+ year history of a left lower quadrant/groin pain.  She has had multiple evaluations with endoscopic and imaging evaluations including gynecologic evaluations (exam, ultrasound and laparoscopy and (for this pain issue.  It is a dull achy pain that comes and goes throughout the day and is unaffected by movement, or defecation.  She struggles with constipation and in the past she was able to use Linzess intermittently at 145 mcg but now that and even 2 pills does not seem to be working like it used to.  The symptoms are very similar to what she is experienced for many years.  She does say she sleeps on her abdomen and when she awakens in the morning she can feel a lot of pressure in that left lower quadrant/groin area.  Recent colonoscopy as follows: Colonoscopy 05/30/2022 - One diminutive polyp in the sigmoid colon,                            removed with a cold snare. Resected and retrieved.                           - The examination was otherwise normal on direct                            and retroflexion views.                           - Personal history of colonic polyps - 05/2017 2                            diminutive adenomas.  05/2017 2 diminutive adenomas  05/30/22 diminutive adenoma recall 7 yrs 2030   She does not have any genitourinary  complaints like urgency or leakage and no history of significant birth trauma though she did have a 10 pound 5 ounce baby vaginally.  She sometimes has dyspareunia in that area of the left lower quadrant/groin.  She is about to start phentermine for weight loss followed at the [facility].  Continues with some menorrhagia also.  On chronic iron therapy which predates constipation issues and she does not think is related.  No Known Allergies Current Meds  Medication Sig   hydrocortisone 2.5 % ointment Apply 1 application topically 2 (two) times daily as needed.   iron polysaccharides (FERREX 150) 150 MG capsule Take 1 capsule (150 mg total) by mouth daily.   LINZESS 145 MCG CAPS capsule Take 145 mcg by mouth as needed. Usually every 2 or three days.   phentermine (ADIPEX-P) 37.5 MG tablet Take 18.75-37.5 mg by mouth every morning.   vitamin  C (ASCORBIC ACID) 500 MG tablet Take 500 mg daily by mouth.   VITAMIN D, ERGOCALCIFEROL, PO Take by mouth.   Past Medical History:  Diagnosis Date   Abdominal pain 1998   Abnormal Pap smear 07/31/2004   CIN 1   Anemia    Back pain 01/17/1997   Depression 09/16/2011   Fibroid 07/11/1997   H/O constipation 01/26/1996   With rectal bleeding   H/O dysmenorrhea    H/O fatigue 01/17/1997   H/O varicella    History of rectal bleeding 05/01/1997   Hx of adenomatous colonic polyps 05/26/2017   Hx of irritable bowel syndrome 01/16/1999   Hypertension    08/02/09   Irregular periods/menstrual cycles 2006   LGSIL (low grade squamous intraepithelial dysplasia)    LLQ pain 02/03/2001   Past Surgical History:  Procedure Laterality Date   COLONOSCOPY     laposcopy     Social History   Social History Narrative   Patient is single   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   Review of Systems   Objective:   Physical Exam BP 130/70   Pulse 78   Ht '5\' 4"'$  (1.626 m)   Wt 166 lb (75.3 kg)   BMI 28.49 kg/m  [name], CMA present.  NAD Abd soft and NT no mass, no hernia. No groin adenopathy Negative Carnett's No pain w/ flexion, extension, int/ext rotaton of hips  Rectal   Anoderm inspection revealed no abnormalities Anal wink was absent Digital exam revealed normal resting tone and voluntary squeeze with suspected gluteal recruitment. No mass or rectocele present. Simulated defecation with valsalva revealed appropriate abdominal contraction and descent.

## 2022-07-18 NOTE — Patient Instructions (Addendum)
We have placed an order for pelvic floor physical therapy. They will contact you about an appointment.   Use over the counter Dulcolax as needed.   Be aware that your phentermine may cause constipation.   I appreciate the opportunity to care for you. Silvano Rusk, MD, [facility]

## 2022-08-21 ENCOUNTER — Other Ambulatory Visit (INDEPENDENT_AMBULATORY_CARE_PROVIDER_SITE_OTHER): Payer: BC Managed Care – PPO

## 2022-08-21 DIAGNOSIS — D497 Neoplasm of unspecified behavior of endocrine glands and other parts of nervous system: Secondary | ICD-10-CM

## 2022-08-21 DIAGNOSIS — E221 Hyperprolactinemia: Secondary | ICD-10-CM

## 2022-08-22 ENCOUNTER — Other Ambulatory Visit: Payer: Commercial Managed Care - PPO

## 2022-08-22 LAB — PROLACTIN: Prolactin: 28.3 ng/mL

## 2022-08-27 ENCOUNTER — Ambulatory Visit: Payer: BC Managed Care – PPO

## 2022-09-02 ENCOUNTER — Ambulatory Visit: Payer: BC Managed Care – PPO

## 2022-09-08 IMAGING — MG MM DIGITAL DIAGNOSTIC UNILAT*R* W/ TOMO W/ CAD
6 series · 6 of 18 positions shown · non-contrast
Comparison: Previous exam(s).

CLINICAL DATA: The patient was called back for a right breast
asymmetry

EXAM:
DIGITAL DIAGNOSTIC UNILATERAL RIGHT MAMMOGRAM WITH TOMOSYNTHESIS AND
CAD
TECHNIQUE: Right digital diagnostic mammography and breast tomosynthesis was
performed. The images were evaluated with computer-aided detection.

[R MLO synth-2D (1 of 2)]
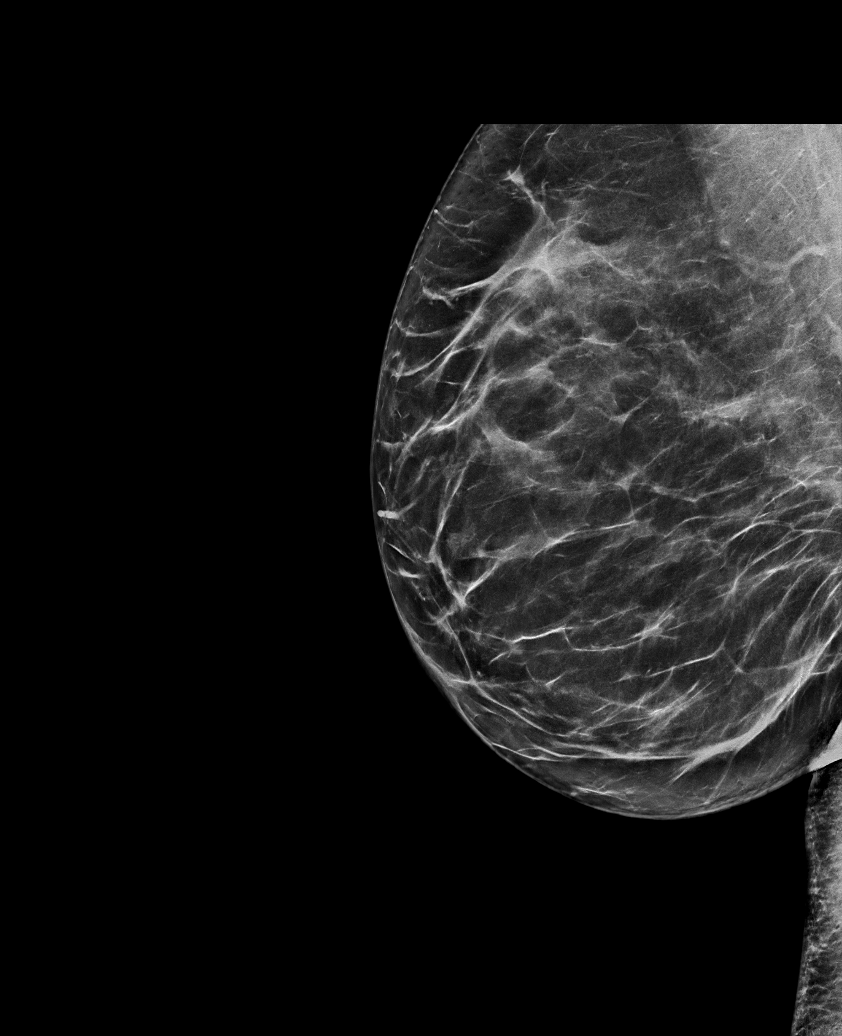

[R ML synth-2D]
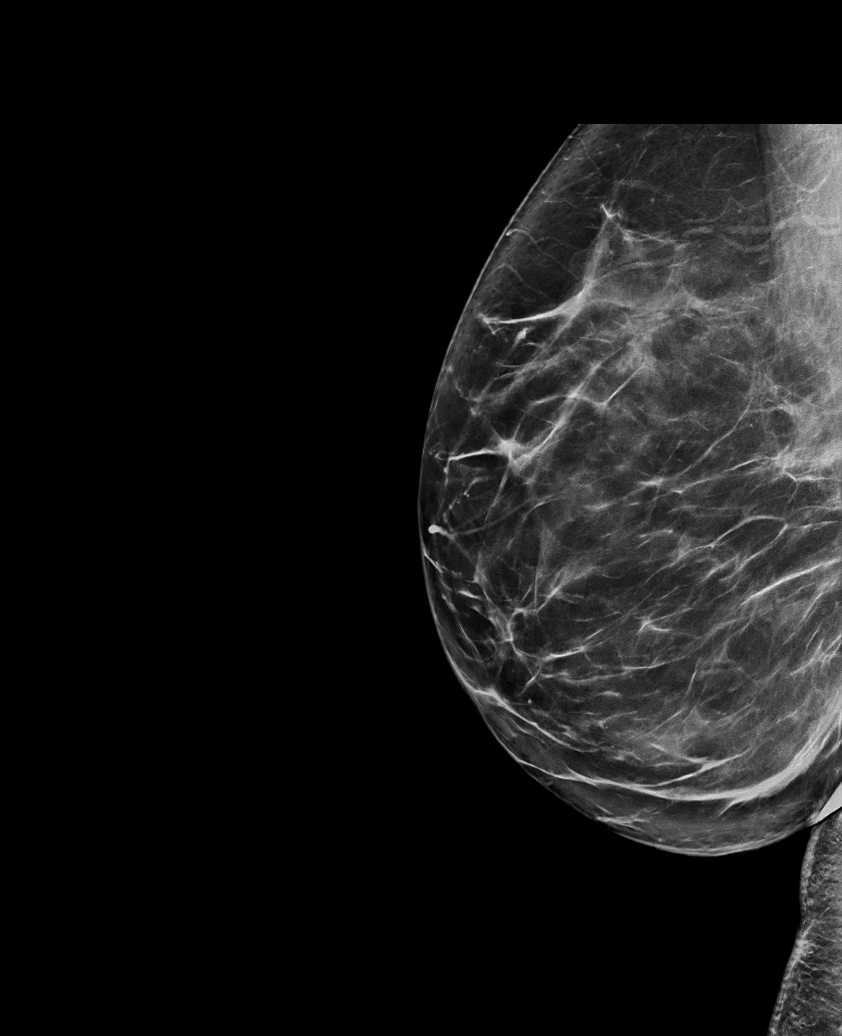

[R MLO synth-2D (2 of 2)]
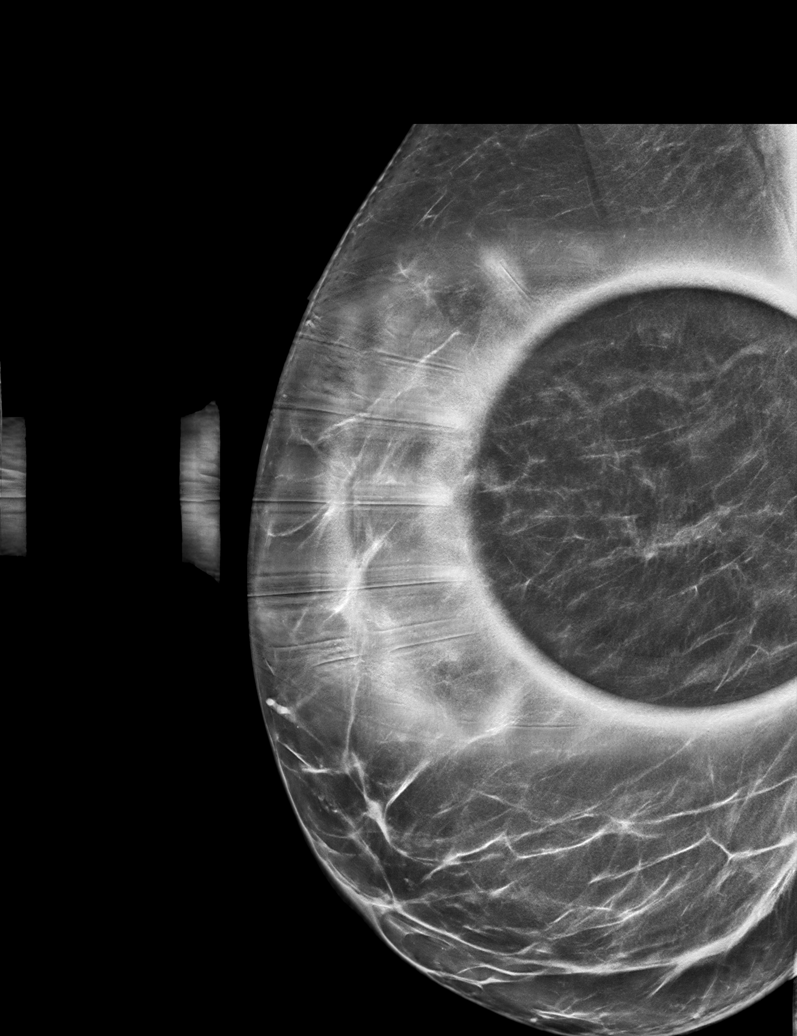

[R MLO tomo (1 of 2) · tomo slice 39/78.0]
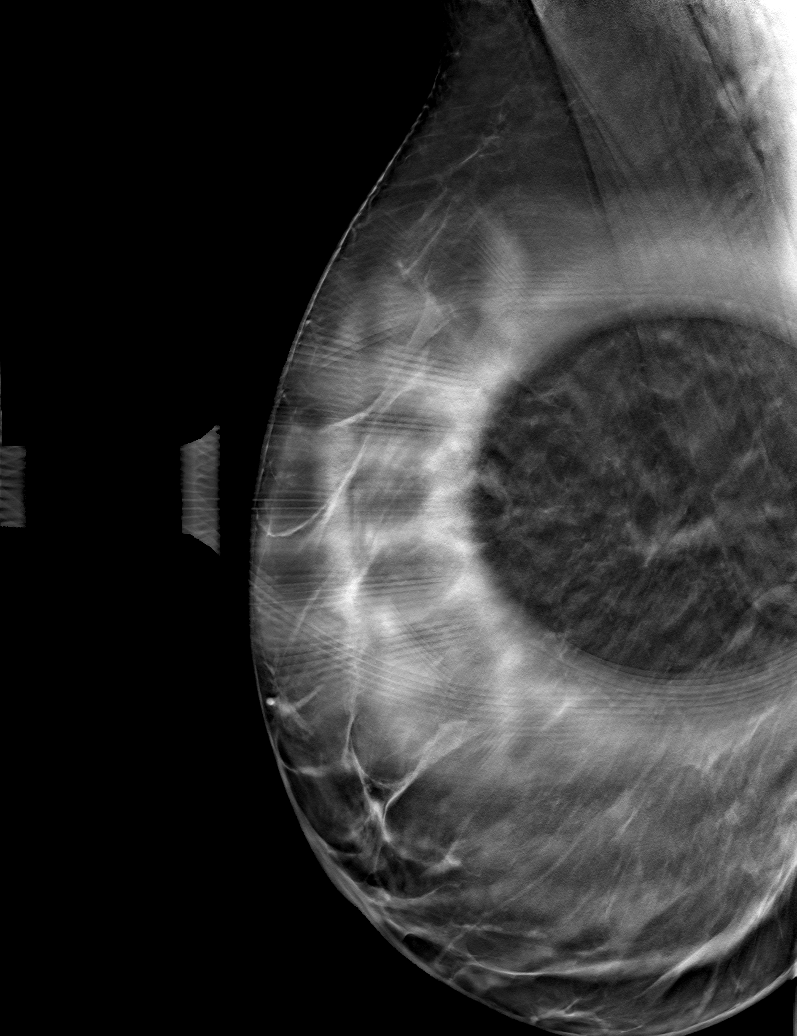

[R MLO tomo (2 of 2) · tomo slice 43/86.0]
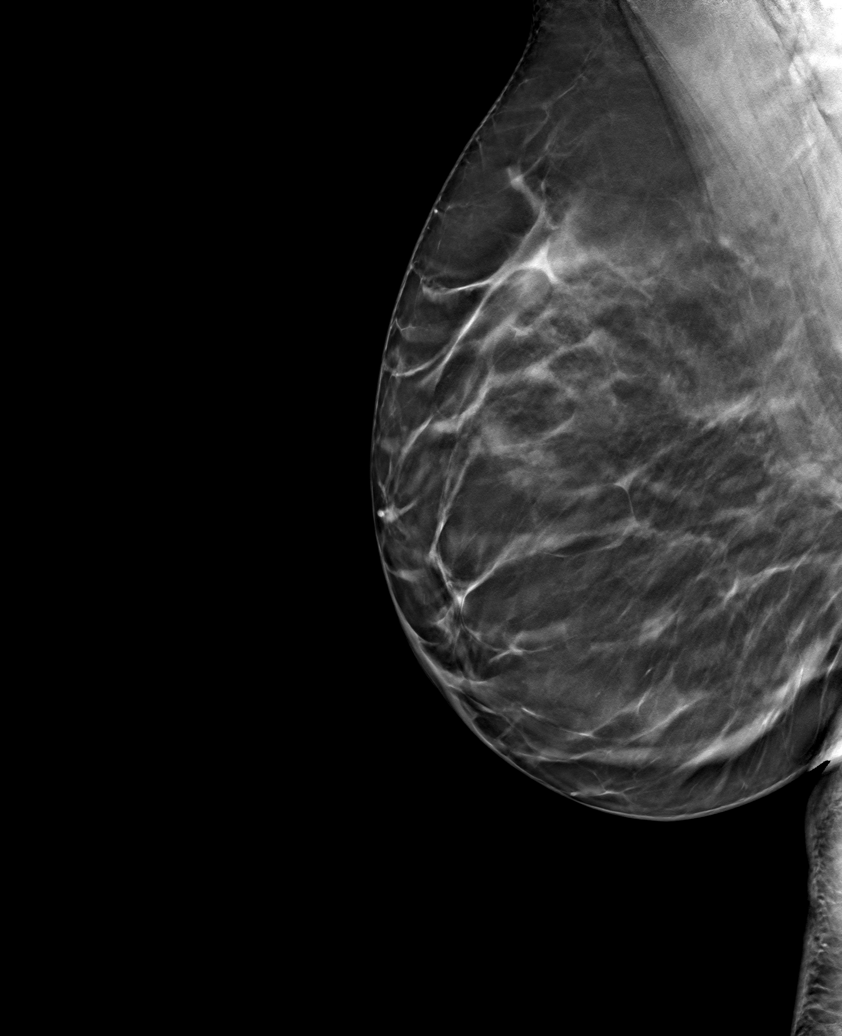

[R ML tomo · tomo slice 47/92.0]
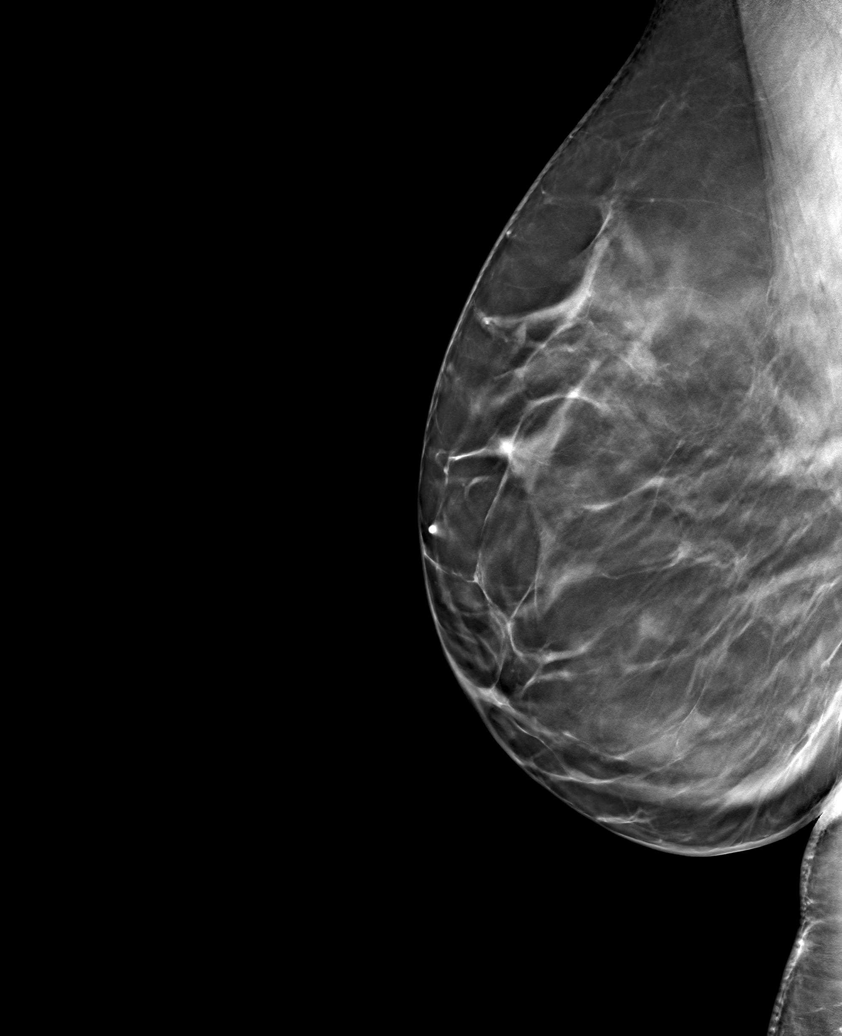

[6 of 18 positions shown; findings below may reference images not displayed]

ACR Breast Density Category b: There are scattered areas of
fibroglandular density.
FINDINGS: The right breast asymmetry has the appearance of glandular tissue
today and is unchanged since 5022 on today's imaging.
IMPRESSION: No mammographic evidence of malignancy.

RECOMMENDATION:
Annual screening mammography.

I have discussed the findings and recommendations with the patient.
If applicable, a reminder letter will be sent to the patient
regarding the next appointment.

BI-RADS CATEGORY  2: Benign.

## 2022-10-10 ENCOUNTER — Other Ambulatory Visit: Payer: Self-pay | Admitting: Obstetrics and Gynecology

## 2022-10-10 DIAGNOSIS — Z1231 Encounter for screening mammogram for malignant neoplasm of breast: Secondary | ICD-10-CM

## 2022-11-13 ENCOUNTER — Ambulatory Visit
Admission: RE | Admit: 2022-11-13 | Discharge: 2022-11-13 | Disposition: A | Discharge status: Home / Self Care | Payer: BC Managed Care – PPO | Source: Ambulatory Visit | Attending: Obstetrics and Gynecology | Admitting: Obstetrics and Gynecology

## 2022-11-13 DIAGNOSIS — Z1231 Encounter for screening mammogram for malignant neoplasm of breast: Secondary | ICD-10-CM

## 2022-12-05 ENCOUNTER — Other Ambulatory Visit (INDEPENDENT_AMBULATORY_CARE_PROVIDER_SITE_OTHER): Payer: BC Managed Care – PPO

## 2022-12-05 DIAGNOSIS — D497 Neoplasm of unspecified behavior of endocrine glands and other parts of nervous system: Secondary | ICD-10-CM | POA: Diagnosis not present

## 2022-12-05 DIAGNOSIS — E221 Hyperprolactinemia: Secondary | ICD-10-CM

## 2022-12-06 LAB — PROLACTIN: Prolactin: 16.3 ng/mL

## 2023-02-20 ENCOUNTER — Encounter: Payer: Self-pay | Admitting: Internal Medicine

## 2023-02-20 ENCOUNTER — Ambulatory Visit: Payer: BC Managed Care – PPO | Admitting: Internal Medicine

## 2023-02-20 VITALS — BP 138/80 | HR 67 | Ht 64.0 in | Wt 170.2 lb

## 2023-02-20 DIAGNOSIS — E221 Hyperprolactinemia: Secondary | ICD-10-CM | POA: Diagnosis not present

## 2023-02-20 DIAGNOSIS — D497 Neoplasm of unspecified behavior of endocrine glands and other parts of nervous system: Secondary | ICD-10-CM

## 2023-02-20 NOTE — Progress Notes (Unsigned)
Patient ID: Mary Preston, female   DOB: 11/11/1970, 52 y.o.   MRN: [id number]   HPI  Mary Preston is a 52 y.o.-year-old female, initially referred by her ObGyn Dr., Dr. Henreitta Leber, returning for follow-up for pituitary adenoma and hyperprolactinemia.  She is the niece of [name], who is also my patient. Last visit 1 year ago.  Interim history: She denies headaches or vision pbs. However, in 11/2022, she stopped having menstrual cycles.  She had hot flashes after being started on lisinopril, which resolved after coming off lisinopril and switching to telmisartan.  She has some breast tenderness.  She previously had stimulated breast discharge, but this resolved. She also has weight gain of 3 lbs. She was on phentermine in the past, which she stopped.   Patient was diagnosed with a pituitary adenoma in 2021 after she presented with 1 missed menstrual cycle, breast tenderness, and a little breast discharge, and also dizziness. At that time she was found to have a high prolactin level.  Pituitary MRI (07/14/2019): 5 to 6 mm pituitary adenoma: Overall pituitary size andconfiguration is normal but on both dynamic and delayed post-contrast images there is a 5-6 millimeter nodular area of hypoenhancement along the central and caudal aspect of the gland slightly eccentric to the right (series 21, image 7 and series 22, image 5. The remainder of the gland enhances homogeneously. Normal pituitary infundibulum, suprasellar cistern, hypothalamus. The cavernous sinuses remain normal. No other abnormal enhancement.    Reviewed pituitary hormone levels:  Prolactin was high and ACTH was undetectably low while the rest of the pituitary hormones, including cortisol, were normal: Component     Latest Ref Rng & Units 08/19/2019  IGF-I, LC/MS     52 - 328 ng/mL 100  Z-Score (Female)     -2.0 - 2 SD -0.7  Prolactin     ng/mL 51.4 (H)  TSH     0.35 - 4.50 uIU/mL 1.53  T4,Free(Direct)      0.60 - 1.60 ng/dL 4.09  Triiodothyronine,Free,Serum     2.3 - 4.2 pg/mL 3.6  Cortisol, Plasma     ug/dL 6.8  W119 ACTH     6 - 50 pg/mL <5 (L)   Since there was low suspicion for adrenal insufficiency, we repeated a cortisol and ACTH a month later and these returned normal and prolactin decreased to the low normal range: Component     Latest Ref Rng & Units 09/30/2019  Estradiol, Free     pg/mL 0.81  Estradiol     pg/mL 47  Prolactin     ng/mL 3.5  Cortisol, Plasma     ug/dL 5.6  J478 ACTH     6 - 50 pg/mL 12  FSH     mIU/ML 25.5  LH     mIU/mL 16.31   In 09/2019, we reduced Cabergoline 0.25 mg weekly.  In 02/2021, we reduced Cabergoline to 0.25 mg every other week.   In 06/2021, we stopped Cabergoline.  Lab Results  Component Value Date   PROLACTIN 16.3 12/05/2022   PROLACTIN 28.3 08/21/2022   PROLACTIN 19.2 02/19/2022   PROLACTIN 14.7 10/22/2021   PROLACTIN 7.4 06/19/2021   PROLACTIN 7.3 02/14/2021   PROLACTIN 7.8 02/23/2020   PROLACTIN 3.5 09/30/2019   PROLACTIN 51.4 (H) 08/19/2019  Previously: 08/08/2018: Prolactin 99.9 (4.8-23.3) 06/01/2019: Prolactin 101 (4.8-23.3), Urine pregnancy test negative  She was previously on Provera for 10 days in 05/2019 but she was able to stop afterwards.  She  has 2 adult children.  She did not have problems with infertility or with breast-feeding.  ROS:  + See HPI  I reviewed pt's medications, allergies, PMH, social hx, family hx, and changes were documented in the history of present illness. Otherwise, unchanged from my initial visit note.  Past Medical History:  Diagnosis Date   Abdominal pain 1998   Abnormal Pap smear 07/31/2004   CIN 1   Anemia    Back pain 01/17/1997   Depression 09/16/2011   Fibroid 07/11/1997   H/O constipation 01/26/1996   With rectal bleeding   H/O dysmenorrhea    H/O fatigue 01/17/1997   H/O varicella    History of rectal bleeding 05/01/1997   Hx of adenomatous colonic polyps 05/26/2017    Hx of irritable bowel syndrome 01/16/1999   Hypertension    08/02/09   Irregular periods/menstrual cycles 2006   LGSIL (low grade squamous intraepithelial dysplasia)    LLQ pain 02/03/2001   Past Surgical History:  Procedure Laterality Date   COLONOSCOPY     laposcopy     Social History   Socioeconomic History   Marital status: Single    Spouse name: Not on file   Number of children: 2   Years of education: Not on file   Highest education level: Not on file  Occupational History    Clinical coding analyst  Tobacco Use   Smoking status: Never Smoker   Smokeless tobacco: Never Used  Substance and Sexual Activity   Alcohol use: No   Drug use: No   Social Determinants of Corporate investment banker Strain:    Difficulty of Paying Living Expenses: Not on file  Food Insecurity:    Worried About Radiation protection practitioner of Food in the Last Year: Not on file   [employer] of Food in the Last Year: Not on file  Transportation Needs:    Lack of Transportation (Medical): Not on file   Lack of Transportation (Non-Medical): Not on file  Physical Activity:    Days of Exercise per Week: Not on file   Minutes of Exercise per Session: Not on file  Stress:    Feeling of Stress : Not on file  Social Connections:    Frequency of Communication with Friends and Family: Not on file   Frequency of Social Gatherings with Friends and Family: Not on file   Attends Religious Services: Not on file   Active Member of Clubs or Organizations: Not on file   Attends Banker Meetings: Not on file   Marital Status: Not on file  Intimate Partner Violence:    Fear of Current or Ex-Partner: Not on file   Emotionally Abused: Not on file   Physically Abused: Not on file   Sexually Abused: Not on file   Current Outpatient Medications on File Prior to Visit  Medication Sig Dispense Refill   hydrocortisone 2.5 % ointment Apply 1 application topically 2 (two) times daily as needed. 28.35 g 5   iron  polysaccharides (FERREX 150) 150 MG capsule Take 1 capsule (150 mg total) by mouth daily. 90 capsule 0   LINZESS 145 MCG CAPS capsule Take 145 mcg by mouth as needed. Usually every 2 or three days.     phentermine (ADIPEX-P) 37.5 MG tablet Take 18.75-37.5 mg by mouth every morning.     vitamin C (ASCORBIC ACID) 500 MG tablet Take 500 mg daily by mouth.     VITAMIN D, ERGOCALCIFEROL, PO Take by mouth.  No current facility-administered medications on file prior to visit.   No Known Allergies Family History  Problem Relation Age of Onset   Hypertension Mother    Hyperlipidemia Mother    Cancer Mother    Hypertension Father    Diabetes Father    Hyperlipidemia Father    Hypertension Paternal Aunt    Diabetes Paternal Aunt    Heart disease Paternal Aunt        MI   Hypertension Paternal Uncle    Heart disease Paternal Uncle    Colon cancer Neg Hx    Stomach cancer Neg Hx    Esophageal cancer Neg Hx    Rectal cancer Neg Hx    PE: BP 138/80   Pulse 67   Ht 5\' 4"  (1.626 m)   Wt 170 lb 3.2 oz (77.2 kg)   SpO2 97%   BMI 29.21 kg/m  Wt Readings from Last 3 Encounters:  02/20/23 170 lb 3.2 oz (77.2 kg)  07/18/22 166 lb (75.3 kg)  05/30/22 170 lb 3.2 oz (77.2 kg)   Constitutional: Normal weight, in NAD Eyes: no exophthalmos ENT: no masses palpated in neck, no cervical lymphadenopathy Cardiovascular: RRR, No MRG Respiratory: CTA B Musculoskeletal: no deformities Skin: no rashes Neurological: no tremor with outstretched hands  ASSESSMENT: 1. Pituitary microadenoma  2.  Hyperprolactinemia  3.  Overweight  PLAN:  1. Patient with history of pituitary microadenoma, diagnosed during investigation for hyperprolactinemia in 06/2019.  As the tumor was measuring less than 1 cm, this is a micro, rather than a macroadenoma. -Pituitary hormone investigation was normal with the exception of a high prolactin.  She was started on Cabergoline -Her prolactin levels normalized so I  advised her to decrease the Cabergoline to 0.25 mg every other week.  Eventually, we ended up stopping Cabergoline in 06/2021.  At that time, prolactin was stable, normal, at 7.4 -At last visit, in 02/2022, prolactin level was still normal, at 19.2.  In 08/2022, this increased to 28.3, slightly high.  However, the latest level was from 3 months ago and this was normal, at 16.3, without the need for Cabergoline -At today's visit, she denies headaches, visual disturbances, galactorrhea, breast tenderness.  Weight is stable since last visit. -We will recheck her prolactin level today.  If she start increasing, we can restart Cabergoline. -If the prolactin level is higher today, we may need another pituitary MRI  2.  Hyperprolactinemia -We discussed at previous visits that there were many possible causes for hyperprolactinemia, however, in her case, the most likely cause is her small pituitary adenoma -No galactorrhea or breast tenderness -At today's visit, we will recheck prolactin level as mentioned above -She does agree to start Cabergoline if needed.  She tolerated it well in the past.  3.  Overweight -She gained a net 3 pounds since last visit.  She is trying to control her diet, but we discussed about other ways to lose weight including a 1 day a week liquid only fast, increasing foods with diuretic effect like ginger tea and celery, switching to low calorie density foods. -We discussed that weight gain can be associated with perimenopause, which she appears to be experience -She is not interested in medications for weight loss, in fact she came off phentermine in the last year  Carlus Pavlov, MD PhD [facility]

## 2023-02-20 NOTE — Patient Instructions (Signed)
Please stop at the lab.  Please continue off Cabergoline.  Please come back for a follow-up appointment in 1 year, but possibly sooner for labs.

## 2023-02-21 LAB — PROLACTIN: Prolactin: 37.6 ng/mL — ABNORMAL HIGH

## 2023-04-04 ENCOUNTER — Ambulatory Visit
Admission: RE | Admit: 2023-04-04 | Discharge: 2023-04-04 | Disposition: A | Discharge status: Home / Self Care | Payer: BC Managed Care – PPO | Source: Ambulatory Visit | Attending: Internal Medicine | Admitting: Internal Medicine

## 2023-04-04 DIAGNOSIS — D497 Neoplasm of unspecified behavior of endocrine glands and other parts of nervous system: Secondary | ICD-10-CM

## 2023-04-04 DIAGNOSIS — E221 Hyperprolactinemia: Secondary | ICD-10-CM

## 2023-04-04 MED ORDER — GADOPICLENOL 0.5 MMOL/ML IV SOLN
8.0000 mL | Freq: Once | INTRAVENOUS | Status: AC | PRN
  Administered 2023-04-04: 8 mL via INTRAVENOUS

## 2023-04-13 ENCOUNTER — Encounter: Payer: Self-pay | Admitting: Internal Medicine

## 2023-08-17 ENCOUNTER — Other Ambulatory Visit: Payer: Self-pay | Admitting: Obstetrics and Gynecology

## 2023-08-17 DIAGNOSIS — Z1231 Encounter for screening mammogram for malignant neoplasm of breast: Secondary | ICD-10-CM

## 2023-11-09 ENCOUNTER — Other Ambulatory Visit: Payer: Self-pay | Admitting: Medical Genetics

## 2023-11-16 ENCOUNTER — Ambulatory Visit
Admission: RE | Admit: 2023-11-16 | Discharge: 2023-11-16 | Disposition: A | Discharge status: Home / Self Care | Source: Ambulatory Visit | Attending: Obstetrics and Gynecology | Admitting: Obstetrics and Gynecology

## 2023-11-16 DIAGNOSIS — Z1231 Encounter for screening mammogram for malignant neoplasm of breast: Secondary | ICD-10-CM

## 2024-02-23 ENCOUNTER — Ambulatory Visit: Admitting: Internal Medicine

## 2024-02-23 ENCOUNTER — Ambulatory Visit: Payer: BC Managed Care – PPO | Admitting: Internal Medicine

## 2024-02-23 ENCOUNTER — Encounter: Payer: Self-pay | Admitting: Internal Medicine

## 2024-02-23 VITALS — BP 130/70 | HR 73

## 2024-02-23 DIAGNOSIS — E221 Hyperprolactinemia: Secondary | ICD-10-CM

## 2024-02-23 DIAGNOSIS — D497 Neoplasm of unspecified behavior of endocrine glands and other parts of nervous system: Secondary | ICD-10-CM

## 2024-02-23 LAB — PROLACTIN: Prolactin: 31.2 ng/mL — ABNORMAL HIGH

## 2024-02-23 NOTE — Progress Notes (Signed)
 Patient ID: Mary Preston, female   DOB: [birth year], 53 y.o.   MRN: [id number]   HPI  Mary Preston is a 53 y.o.-year-old female, initially referred by her ObGyn Dr., Dr. Madolyn Monte, returning for follow-up for pituitary adenoma and hyperprolactinemia.  She is the niece of [name], who is also my patient. Last visit 1 year ago.  Interim history: She denies headaches or vision pbs. She is still having regular menstrual cycles.  No breast tenderness.  She previously had stimulated breast discharge, but this resolved. She is on Veozah for hot flushes - but off for 3 days 2/2 $.  Patient was diagnosed with a pituitary adenoma in 2021 after she presented with 1 missed menstrual cycle, breast tenderness, and a little breast discharge, and also dizziness. At that time she was found to have a high prolactin level.  Pituitary MRI (07/14/2019): 5 to 6 mm pituitary adenoma: Overall pituitary size andconfiguration is normal but on both dynamic and delayed post-contrast images there is a 5-6 millimeter nodular area of hypoenhancement along the central and caudal aspect of the gland slightly eccentric to the right (series 21, image 7 and series 22, image 5. The remainder of the gland enhances homogeneously. Normal pituitary infundibulum, suprasellar cistern, hypothalamus. The cavernous sinuses remain normal. No other abnormal enhancement.    Pituitary MRI (04/04/2023): Pituitary/Sella: There is a 6 mm x 3 mm focus of hypoenhancement along the right aspect of the floor of the sella, decreased in size from 7 mm x 4 mm on the prior study from 2021 when remeasured using similar technique (26-4). The pituitary stalk is midline.  Reviewed pituitary hormone levels:  Prolactin was high and ACTH was undetectably low while the rest of the pituitary hormones, including cortisol, were normal: Component     Latest Ref Rng & Units 08/19/2019  IGF-I, LC/MS     52 - 328 ng/mL 100  Z-Score  (Female)     -2.0 - 2 SD -0.7  Prolactin     ng/mL 51.4 (H)  TSH     0.35 - 4.50 uIU/mL 1.53  T4,Free(Direct)     0.60 - 1.60 ng/dL 9.14  Triiodothyronine,Free,Serum     2.3 - 4.2 pg/mL 3.6  Cortisol, Plasma     ug/dL 6.8  R793 ACTH     6 - 50 pg/mL <5 (L)   Since there was low suspicion for adrenal insufficiency, we repeated a cortisol and ACTH a month later and these returned normal and prolactin decreased to the low normal range: Component     Latest Ref Rng & Units 09/30/2019  Estradiol, Free     pg/mL 0.81  Estradiol     pg/mL 47  Prolactin     ng/mL 3.5  Cortisol, Plasma     ug/dL 5.6  R793 ACTH     6 - 50 pg/mL 12  FSH     mIU/ML 25.5  LH     mIU/mL 16.31   In 09/2019, we reduced Cabergoline  0.25 mg weekly.  In 02/2021, we reduced Cabergoline  to 0.25 mg every other week.   In 06/2021, we stopped Cabergoline .  Lab Results  Component Value Date   PROLACTIN 37.6 (H) 02/20/2023   PROLACTIN 16.3 12/05/2022   PROLACTIN 28.3 08/21/2022   PROLACTIN 19.2 02/19/2022   PROLACTIN 14.7 10/22/2021   PROLACTIN 7.4 06/19/2021   PROLACTIN 7.3 02/14/2021   PROLACTIN 7.8 02/23/2020   PROLACTIN 3.5 09/30/2019   PROLACTIN 51.4 (H) 08/19/2019  Previously: 08/08/2018:  Prolactin 99.9 (4.8-23.3) 06/01/2019: Prolactin 101 (4.8-23.3), Urine pregnancy test negative  She was previously on Provera for 10 days in 05/2019 but she was able to stop afterwards.  She has 2 adult children.  She did not have problems with infertility or with breast-feeding.  ROS:  + See HPI  I reviewed pt's medications, allergies, PMH, social hx, family hx, and changes were documented in the history of present illness. Otherwise, unchanged from my initial visit note.  Past Medical History:  Diagnosis Date   Abdominal pain 1998   Abnormal Pap smear 07/31/2004   CIN 1   Anemia    Back pain 01/17/1997   Depression 09/16/2011   Fibroid 07/11/1997   H/O constipation 01/26/1996   With rectal bleeding    H/O dysmenorrhea    H/O fatigue 01/17/1997   H/O varicella    History of rectal bleeding 05/01/1997   Hx of adenomatous colonic polyps 05/26/2017   Hx of irritable bowel syndrome 01/16/1999   Hypertension    08/02/09   Irregular periods/menstrual cycles 2006   LGSIL (low grade squamous intraepithelial dysplasia)    LLQ pain 02/03/2001   Past Surgical History:  Procedure Laterality Date   COLONOSCOPY     laposcopy     Social History   Socioeconomic History   Marital status: Single    Spouse name: Not on file   Number of children: 2   Years of education: Not on file   Highest education level: Not on file  Occupational History    Clinical coding analyst  Tobacco Use   Smoking status: Never Smoker   Smokeless tobacco: Never Used  Substance and Sexual Activity   Alcohol use: No   Drug use: No   Social Determinants of Corporate investment banker Strain:    Difficulty of Paying Living Expenses: Not on file  Food Insecurity:    Worried About Radiation protection practitioner of Food in the Last Year: Not on file   [employer] of Food in the Last Year: Not on file  Transportation Needs:    Lack of Transportation (Medical): Not on file   Lack of Transportation (Non-Medical): Not on file  Physical Activity:    Days of Exercise per Week: Not on file   Minutes of Exercise per Session: Not on file  Stress:    Feeling of Stress : Not on file  Social Connections:    Frequency of Communication with Friends and Family: Not on file   Frequency of Social Gatherings with Friends and Family: Not on file   Attends Religious Services: Not on file   Active Member of Clubs or Organizations: Not on file   Attends Banker Meetings: Not on file   Marital Status: Not on file  Intimate Partner Violence:    Fear of Current or Ex-Partner: Not on file   Emotionally Abused: Not on file   Physically Abused: Not on file   Sexually Abused: Not on file   Current Outpatient Medications on File Prior to  Visit  Medication Sig Dispense Refill   hydrocortisone  2.5 % ointment Apply 1 application topically 2 (two) times daily as needed. 28.35 g 5   iron  polysaccharides (FERREX 150) 150 MG capsule Take 1 capsule (150 mg total) by mouth daily. 90 capsule 0   LINZESS 145 MCG CAPS capsule Take 145 mcg by mouth as needed. Usually every 2 or three days.     phentermine (ADIPEX-P) 37.5 MG tablet Take 18.75-37.5 mg by mouth every  morning. (Patient not taking: Reported on 02/20/2023)     vitamin C (ASCORBIC ACID) 500 MG tablet Take 500 mg daily by mouth. (Patient not taking: Reported on 02/20/2023)     VITAMIN D, ERGOCALCIFEROL, PO Take by mouth. (Patient not taking: Reported on 02/20/2023)     No current facility-administered medications on file prior to visit.   No Known Allergies Family History  Problem Relation Age of Onset   Hypertension Mother    Hyperlipidemia Mother    Cancer Mother    Hypertension Father    Diabetes Father    Hyperlipidemia Father    Hypertension Paternal Aunt    Diabetes Paternal Aunt    Heart disease Paternal Aunt        MI   Hypertension Paternal Uncle    Heart disease Paternal Uncle    Colon cancer Neg Hx    Stomach cancer Neg Hx    Esophageal cancer Neg Hx    Rectal cancer Neg Hx    PE: BP 130/70   Pulse 73   SpO2 99%  Pt. Declined weighing. Wt Readings from Last 10 Encounters:  02/20/23 170 lb 3.2 oz (77.2 kg)  07/18/22 166 lb (75.3 kg)  05/30/22 170 lb 3.2 oz (77.2 kg)  05/23/22 170 lb 3.2 oz (77.2 kg)  05/02/22 170 lb 3.2 oz (77.2 kg)  02/19/22 167 lb (75.8 kg)  02/14/21 161 lb 3.2 oz (73.1 kg)  02/17/20 161 lb (73 kg)  08/19/19 160 lb (72.6 kg)  05/19/17 156 lb (70.8 kg)   Constitutional: Normal weight, in NAD Eyes: no exophthalmos ENT: no masses palpated in neck, no cervical lymphadenopathy Cardiovascular: RRR, No MRG Respiratory: CTA B Musculoskeletal: no deformities Skin: no rashes Neurological: no tremor with outstretched  hands  ASSESSMENT: 1. Pituitary microadenoma  2.  Hyperprolactinemia  PLAN:  1. Patient with history of pituitary microadenoma, diagnosed during this deviation for hyperprolactinemia in 06/2019.  As the tumor was measuring less than 1 cm, this was qualified as a micro rather than the macroadenoma.  Pituitary hormonal investigation was normal with the exception of a high prolactin.  She was started on Cabergoline  and we were able to decrease her dose to 0.25 mg every other week after prolactin level normalized.  We ended up stopping Cabergoline  in 06/2021, when prolactin was stable, normal, at 7.4.  Prolactin level started to increase afterwards, and at last visit, this was 37.6.  I did not necessarily suggest to restart Cabergoline  at that time due to postmenopausal status, but I did suggest to have another pituitary MRI. -She had the pituitary MRI on 04/04/2023: The pituitary tumor measured 6 x 3 mm, decreased in size since prior MRI from 2021.  This is good news. -At today's visit, she denies headaches, visual disturbances, galactorrhea, breast tenderness.  Weight is approximately stable.  Also, at last visit, she was mentioning that her menstrual cycles had started 11/2022, but at today's visit she mentions regular menstrual cycles.  She is on Veozah for hot flashes, but this is expensive for her ($75 per month), and she is off for the last 3 days. -Please also see problem #2 - I will see her back in a year  2.  Hyperprolactinemia -We discussed at previous visits that there were many possible causes for hyperprolactinemia, in her case, the most likely cause is her small pituitary adenoma - Denies galactorrhea or breast tenderness -At last visit, prolactin was only slightly elevated, at 37.6 and we discussed that no treatment is  absolutely necessary in the absence of symptoms in a postmenopausal woman. - Will repeat her prolactin level today - She agrees to start Cabergoline  if needed.  She  tolerated it well in the past  Orders Placed This Encounter  Procedures   Prolactin   Lela Fendt, MD PhD [facility]

## 2024-02-23 NOTE — Patient Instructions (Signed)
Please stop at the lab.  Please continue off Cabergoline.  Please come back for a follow-up appointment in 1 year, but possibly sooner for labs.

## 2024-02-24 ENCOUNTER — Ambulatory Visit: Payer: Self-pay | Admitting: Internal Medicine

## 2024-04-08 ENCOUNTER — Other Ambulatory Visit: Payer: Self-pay | Admitting: Medical Genetics

## 2024-04-08 DIAGNOSIS — Z006 Encounter for examination for normal comparison and control in clinical research program: Secondary | ICD-10-CM

## 2025-02-17 ENCOUNTER — Ambulatory Visit: Admitting: Internal Medicine
